# Patient Record
Sex: Female | Born: 1935
Health system: Southern US, Community
[De-identification: ages and names within clinical notes are randomized; demographics above are authoritative.]

## PROBLEM LIST (undated history)

## (undated) DIAGNOSIS — I10 Essential (primary) hypertension: Secondary | ICD-10-CM

## (undated) DIAGNOSIS — I7300 Raynaud's syndrome without gangrene: Secondary | ICD-10-CM

## (undated) DIAGNOSIS — I639 Cerebral infarction, unspecified: Secondary | ICD-10-CM

## (undated) DIAGNOSIS — M5416 Radiculopathy, lumbar region: Secondary | ICD-10-CM

## (undated) DIAGNOSIS — L659 Nonscarring hair loss, unspecified: Secondary | ICD-10-CM

## (undated) DIAGNOSIS — K219 Gastro-esophageal reflux disease without esophagitis: Secondary | ICD-10-CM

## (undated) DIAGNOSIS — J189 Pneumonia, unspecified organism: Secondary | ICD-10-CM

## (undated) DIAGNOSIS — J40 Bronchitis, not specified as acute or chronic: Secondary | ICD-10-CM

## (undated) DIAGNOSIS — R51 Headache: Secondary | ICD-10-CM

## (undated) DIAGNOSIS — M1990 Unspecified osteoarthritis, unspecified site: Secondary | ICD-10-CM

## (undated) DIAGNOSIS — H9190 Unspecified hearing loss, unspecified ear: Secondary | ICD-10-CM

## (undated) HISTORY — PX: BACK SURGERY: SHX140

## (undated) HISTORY — PX: BREAST CYST ASPIRATION: SHX578

## (undated) HISTORY — DX: Cerebral infarction, unspecified: I63.9

## (undated) HISTORY — PX: TONSILLECTOMY: SUR1361

## (undated) HISTORY — DX: Unspecified hearing loss, unspecified ear: H91.90

## (undated) HISTORY — DX: Gastro-esophageal reflux disease without esophagitis: K21.9

## (undated) HISTORY — DX: Unspecified osteoarthritis, unspecified site: M19.90

## (undated) HISTORY — DX: Radiculopathy, lumbar region: M54.16

## (undated) HISTORY — DX: Nonscarring hair loss, unspecified: L65.9

## (undated) HISTORY — PX: ABDOMINAL HYSTERECTOMY: SHX81

## (undated) HISTORY — DX: Raynaud's syndrome without gangrene: I73.00

## (undated) HISTORY — PX: OTHER SURGICAL HISTORY: SHX169

---

## 1998-06-13 ENCOUNTER — Ambulatory Visit (HOSPITAL_COMMUNITY): Admission: RE | Admit: 1998-06-13 | Discharge: 1998-06-13 | Payer: Self-pay | Admitting: Internal Medicine

## 1998-09-26 ENCOUNTER — Encounter: Payer: Self-pay | Admitting: Internal Medicine

## 1998-09-26 ENCOUNTER — Ambulatory Visit (HOSPITAL_COMMUNITY): Admission: RE | Admit: 1998-09-26 | Discharge: 1998-09-26 | Payer: Self-pay | Admitting: Internal Medicine

## 1999-03-12 ENCOUNTER — Encounter: Payer: Self-pay | Admitting: Internal Medicine

## 1999-03-12 ENCOUNTER — Ambulatory Visit (HOSPITAL_COMMUNITY): Admission: RE | Admit: 1999-03-12 | Discharge: 1999-03-12 | Payer: Self-pay | Admitting: Internal Medicine

## 1999-04-11 ENCOUNTER — Encounter: Payer: Self-pay | Admitting: Urology

## 1999-04-12 ENCOUNTER — Inpatient Hospital Stay (HOSPITAL_COMMUNITY): Admission: AD | Admit: 1999-04-12 | Discharge: 1999-04-14 | Payer: Self-pay | Admitting: Urology

## 1999-07-19 ENCOUNTER — Ambulatory Visit (HOSPITAL_COMMUNITY): Admission: RE | Admit: 1999-07-19 | Discharge: 1999-07-19 | Payer: Self-pay | Admitting: Internal Medicine

## 1999-07-19 ENCOUNTER — Encounter: Payer: Self-pay | Admitting: Internal Medicine

## 1999-11-12 DIAGNOSIS — I639 Cerebral infarction, unspecified: Secondary | ICD-10-CM

## 1999-11-12 HISTORY — DX: Cerebral infarction, unspecified: I63.9

## 2000-06-09 ENCOUNTER — Encounter: Payer: Self-pay | Admitting: Internal Medicine

## 2000-06-09 ENCOUNTER — Ambulatory Visit (HOSPITAL_COMMUNITY): Admission: RE | Admit: 2000-06-09 | Discharge: 2000-06-09 | Payer: Self-pay | Admitting: Internal Medicine

## 2000-06-13 ENCOUNTER — Encounter: Admission: RE | Admit: 2000-06-13 | Discharge: 2000-06-13 | Payer: Self-pay | Admitting: Internal Medicine

## 2000-06-13 ENCOUNTER — Encounter: Payer: Self-pay | Admitting: Internal Medicine

## 2000-06-17 ENCOUNTER — Encounter: Admission: RE | Admit: 2000-06-17 | Discharge: 2000-06-17 | Payer: Self-pay | Admitting: Internal Medicine

## 2000-06-17 ENCOUNTER — Encounter: Payer: Self-pay | Admitting: Internal Medicine

## 2000-08-08 ENCOUNTER — Encounter: Payer: Self-pay | Admitting: Gastroenterology

## 2000-08-08 ENCOUNTER — Ambulatory Visit (HOSPITAL_COMMUNITY): Admission: RE | Admit: 2000-08-08 | Discharge: 2000-08-08 | Payer: Self-pay | Admitting: Gastroenterology

## 2000-08-12 ENCOUNTER — Encounter: Payer: Self-pay | Admitting: Internal Medicine

## 2000-08-12 ENCOUNTER — Encounter: Admission: RE | Admit: 2000-08-12 | Discharge: 2000-08-12 | Payer: Self-pay | Admitting: Internal Medicine

## 2000-10-13 ENCOUNTER — Encounter (HOSPITAL_BASED_OUTPATIENT_CLINIC_OR_DEPARTMENT_OTHER): Payer: Self-pay | Admitting: General Surgery

## 2000-10-15 ENCOUNTER — Encounter (HOSPITAL_BASED_OUTPATIENT_CLINIC_OR_DEPARTMENT_OTHER): Payer: Self-pay | Admitting: General Surgery

## 2000-10-15 ENCOUNTER — Encounter (INDEPENDENT_AMBULATORY_CARE_PROVIDER_SITE_OTHER): Payer: Self-pay | Admitting: Specialist

## 2000-10-16 ENCOUNTER — Inpatient Hospital Stay (HOSPITAL_COMMUNITY): Admission: RE | Admit: 2000-10-16 | Discharge: 2000-10-17 | Payer: Self-pay | Admitting: General Surgery

## 2000-12-26 ENCOUNTER — Encounter: Admission: RE | Admit: 2000-12-26 | Discharge: 2000-12-26 | Payer: Self-pay | Admitting: Internal Medicine

## 2000-12-26 ENCOUNTER — Encounter: Payer: Self-pay | Admitting: Internal Medicine

## 2001-07-23 ENCOUNTER — Encounter: Payer: Self-pay | Admitting: Internal Medicine

## 2001-07-23 ENCOUNTER — Encounter: Admission: RE | Admit: 2001-07-23 | Discharge: 2001-07-23 | Payer: Self-pay | Admitting: Internal Medicine

## 2001-10-26 ENCOUNTER — Encounter: Admission: RE | Admit: 2001-10-26 | Discharge: 2001-10-26 | Payer: Self-pay | Admitting: Internal Medicine

## 2001-10-26 ENCOUNTER — Encounter: Payer: Self-pay | Admitting: Internal Medicine

## 2002-08-20 ENCOUNTER — Encounter: Payer: Self-pay | Admitting: Family Medicine

## 2002-08-20 ENCOUNTER — Encounter: Admission: RE | Admit: 2002-08-20 | Discharge: 2002-08-20 | Payer: Self-pay | Admitting: Family Medicine

## 2003-07-10 ENCOUNTER — Inpatient Hospital Stay (HOSPITAL_COMMUNITY): Admission: EM | Admit: 2003-07-10 | Discharge: 2003-07-16 | Payer: Self-pay | Admitting: Emergency Medicine

## 2003-07-10 ENCOUNTER — Encounter: Payer: Self-pay | Admitting: Neurology

## 2003-07-11 ENCOUNTER — Encounter: Payer: Self-pay | Admitting: Neurology

## 2003-07-12 ENCOUNTER — Encounter (INDEPENDENT_AMBULATORY_CARE_PROVIDER_SITE_OTHER): Payer: Self-pay | Admitting: Cardiology

## 2003-09-16 ENCOUNTER — Ambulatory Visit (HOSPITAL_COMMUNITY): Admission: RE | Admit: 2003-09-16 | Discharge: 2003-09-16 | Payer: Self-pay | Admitting: Internal Medicine

## 2003-10-07 ENCOUNTER — Encounter: Admission: RE | Admit: 2003-10-07 | Discharge: 2003-10-07 | Payer: Self-pay | Admitting: Internal Medicine

## 2004-04-06 ENCOUNTER — Encounter: Admission: RE | Admit: 2004-04-06 | Discharge: 2004-04-06 | Payer: Self-pay | Admitting: Internal Medicine

## 2004-05-27 ENCOUNTER — Emergency Department (HOSPITAL_COMMUNITY): Admission: EM | Admit: 2004-05-27 | Discharge: 2004-05-27 | Payer: Self-pay | Admitting: Emergency Medicine

## 2004-08-07 ENCOUNTER — Encounter: Admission: RE | Admit: 2004-08-07 | Discharge: 2004-08-07 | Payer: Self-pay | Admitting: Neurology

## 2004-08-09 ENCOUNTER — Encounter: Admission: RE | Admit: 2004-08-09 | Discharge: 2004-08-09 | Payer: Self-pay | Admitting: Neurology

## 2004-08-24 ENCOUNTER — Encounter: Admission: RE | Admit: 2004-08-24 | Discharge: 2004-08-24 | Payer: Self-pay | Admitting: Neurology

## 2004-09-11 ENCOUNTER — Encounter: Admission: RE | Admit: 2004-09-11 | Discharge: 2004-09-11 | Payer: Self-pay | Admitting: Neurology

## 2005-04-15 ENCOUNTER — Encounter: Admission: RE | Admit: 2005-04-15 | Discharge: 2005-04-15 | Payer: Self-pay | Admitting: Internal Medicine

## 2006-01-08 ENCOUNTER — Encounter: Admission: RE | Admit: 2006-01-08 | Discharge: 2006-01-08 | Payer: Self-pay | Admitting: Internal Medicine

## 2006-06-16 ENCOUNTER — Encounter: Admission: RE | Admit: 2006-06-16 | Discharge: 2006-06-16 | Payer: Self-pay | Admitting: Internal Medicine

## 2006-06-20 ENCOUNTER — Encounter: Admission: RE | Admit: 2006-06-20 | Discharge: 2006-06-20 | Payer: Self-pay | Admitting: Internal Medicine

## 2007-10-09 ENCOUNTER — Encounter: Admission: RE | Admit: 2007-10-09 | Discharge: 2007-10-09 | Payer: Self-pay | Admitting: Internal Medicine

## 2008-08-25 ENCOUNTER — Encounter: Admission: RE | Admit: 2008-08-25 | Discharge: 2008-08-25 | Payer: Self-pay | Admitting: Internal Medicine

## 2008-10-10 ENCOUNTER — Encounter: Admission: RE | Admit: 2008-10-10 | Discharge: 2008-10-10 | Payer: Self-pay | Admitting: Internal Medicine

## 2008-12-22 ENCOUNTER — Ambulatory Visit: Payer: Self-pay | Admitting: Cardiology

## 2008-12-23 ENCOUNTER — Ambulatory Visit (HOSPITAL_COMMUNITY): Admission: RE | Admit: 2008-12-23 | Discharge: 2008-12-23 | Payer: Self-pay | Admitting: Cardiology

## 2008-12-23 ENCOUNTER — Encounter: Payer: Self-pay | Admitting: Cardiology

## 2009-07-27 ENCOUNTER — Encounter: Admission: RE | Admit: 2009-07-27 | Discharge: 2009-07-27 | Payer: Self-pay | Admitting: Family Medicine

## 2009-08-29 ENCOUNTER — Encounter: Admission: RE | Admit: 2009-08-29 | Discharge: 2009-08-29 | Payer: Self-pay | Admitting: Internal Medicine

## 2009-09-18 ENCOUNTER — Encounter: Admission: RE | Admit: 2009-09-18 | Discharge: 2009-09-18 | Payer: Self-pay | Admitting: Internal Medicine

## 2009-10-25 ENCOUNTER — Encounter: Admission: RE | Admit: 2009-10-25 | Discharge: 2009-10-25 | Payer: Self-pay | Admitting: Internal Medicine

## 2010-01-08 ENCOUNTER — Encounter: Admission: RE | Admit: 2010-01-08 | Discharge: 2010-01-08 | Payer: Self-pay | Admitting: Internal Medicine

## 2010-02-14 ENCOUNTER — Encounter: Admission: RE | Admit: 2010-02-14 | Discharge: 2010-02-14 | Payer: Self-pay | Admitting: Internal Medicine

## 2010-06-20 ENCOUNTER — Encounter: Admission: RE | Admit: 2010-06-20 | Discharge: 2010-06-20 | Payer: Self-pay | Admitting: Internal Medicine

## 2010-07-25 ENCOUNTER — Encounter: Admission: RE | Admit: 2010-07-25 | Discharge: 2010-07-25 | Payer: Self-pay | Admitting: Internal Medicine

## 2010-08-19 ENCOUNTER — Emergency Department (HOSPITAL_COMMUNITY): Admission: EM | Admit: 2010-08-19 | Discharge: 2010-08-19 | Payer: Self-pay | Admitting: Emergency Medicine

## 2010-10-26 ENCOUNTER — Encounter
Admission: RE | Admit: 2010-10-26 | Discharge: 2010-10-26 | Payer: Self-pay | Source: Home / Self Care | Attending: Internal Medicine | Admitting: Internal Medicine

## 2010-12-01 ENCOUNTER — Encounter: Payer: Self-pay | Admitting: Internal Medicine

## 2010-12-02 ENCOUNTER — Encounter: Payer: Self-pay | Admitting: Internal Medicine

## 2011-03-29 NOTE — Consult Note (Signed)
NAME:  Rachael Buckley, Rachael Buckley                         ACCOUNT NO.:  [id number]   MEDICAL RECORD NO.:  [id number]                   PATIENT TYPE:  INP   LOCATION:  3006                                 FACILITY:  [facility]   PHYSICIAN:  Armanda Magic, M.D.                  DATE OF BIRTH:  [birth year]   DATE OF CONSULTATION:  07/11/2003  DATE OF DISCHARGE:                                   CONSULTATION   REFERRING PHYSICIAN:  Genene Churn. Love, M.D.   PRIMARY CARE PHYSICIAN:  Candyce Churn, M.D.   CHIEF COMPLAINT:  Aphasia with possible CVA versus TIA and palpitations.   HISTORY OF PRESENT ILLNESS:  This is a very pleasant, 77 year old, black  female with no previous cardiac history, although she did have some chest  pain approximately 2 years ago and underwent stress testing at that time,  was told was normal.  Apparently, was admitted on July 10, 2003, with  evolution of aphasia.  She has no known history of coronary disease,  hypertension, diabetes, or stroke in the past or cigarette use.  By around  4:00 p.m. on August 29, was cooking and tried to call someone but could not  get her words out.  She was taken to the emergency room.  In the emergency  room, her speech did improve, although she is still having difficulty  finding her words.  Head CT was negative for stroke.  The patient has  complained of episodic palpitations, especially at nighttime, which she  describes a sensation of her heart fluttering.   PAST MEDICAL HISTORY:  Status post left and right eye cataract surgery,  gastroesophageal reflux disease.  No allergies.  Status post  cholecystectomy, status post hysterectomy.   MEDICATIONS ON ADMISSION:  1. Zyrtec.  2. Protonix.  3. Natural herbs.   SOCIAL HISTORY:  She is married, she has an Paediatric nurse.  She  works at [employer].  No alcohol use.  No smoking.   ALLERGIES:  None.   REVIEW OF SYSTEMS:  As stated in HPI.   PHYSICAL EXAMINATION:  VITAL SIGNS:   Blood pressure 134/70, heart rate 61,  respirations 18.  GENERAL:  Well-developed, well-nourished, black female in no acute distress.  HEENT:  Benign.  NECK:  Supple without lymphadenopathy.  Carotid upstroke +2 bilaterally.  No  bruits.  LUNGS:  Clear to auscultation.  HEART:  Regular rate and rhythm, no murmurs, rubs, or gallops.  Normal S1,  S2.  ABDOMEN:  Soft, nontender, nondistended with active bowel sounds.  No  hepatosplenomegaly.  EXTREMITIES:  No cyanosis, erythema or edema.  Distal pulses.  NEUROLOGIC:  Alert and oriented times three.  She is somewhat aphasic  though.   DIAGNOSTIC IMPRESSION:  Head CT is normal.  EKG is pending.  Ribbon strip  shows normal sinus rhythm.   ASSESSMENT:  1. Aphasia with negative head CT most consist with  a transient ischemic     attack.  2. Palpitations, questions whether she may be having atrial arrhythmia such     as paroxysmal atrial flutter or fibrillation, which would increase her     risk a stroke.   PLAN:  1. Transesophageal echo tomorrow to rule out source of embolus.  2. We will need event monitor at discharge to rule out atrial arrhythmias.                                               Armanda Magic, M.D.    TT/MEDQ  D:  07/11/2003  T:  07/11/2003  Job:  [id number]   cc:   Genene Churn. Love, M.D.  [address]  [address]  [place]  [place] [address]  Fax: [phone]   Candyce Churn, M.D.  [address] [place]  [place] [address]  Fax: [phone]

## 2011-03-29 NOTE — H&P (Signed)
NAME:  Rachael Buckley, Rachael Buckley                         ACCOUNT NO.:  [id number]   MEDICAL RECORD NO.:  [id number]                   PATIENT TYPE:  EMS   LOCATION:  MINO                                 FACILITY:  [facility]   PHYSICIAN:  Genene Churn. Love, M.D.                 DATE OF BIRTH:  1931/06/30   DATE OF ADMISSION:  07/10/2003  DATE OF DISCHARGE:                                HISTORY & PHYSICAL   PATIENT'S ADDRESS:  [address]  [place], [place] [address]   REASON FOR ADMISSION:  This is one of several [facility] admissions  for this 77 year old right-handed black, married female from [place],  [place], admitted from the emergency room for evaluation of a failure  here following code stroke alarm.   HISTORY OF PRESENT ILLNESS:  Rachael Buckley has no known prior history of high  blood pressure, diabetes mellitus, coronary artery disease, stroke, or  cigarette use.  She has not been on hormone therapy but uses natural herbs  and is not on aspirin.  The day of admission, she had been cooking around  noontime to in the afternoon on Sunday, at about 4 o'clock was calling her  family to come to the meal and had difficulty getting her words out.  There  was no associated focal weakness, seizure activity, syncope, complaints of  chest pain or palpitations.  She arrived at the emergency room very quickly,  was seen by Dr. Pearletha Alfred and a CT scan of the brain was obtained  which, to my eye, showed some calcification in the basal ganglia region, but  otherwise was unremarkable.  In the emergency room, her symptomatology  improved.  She has no history of previous symptomatology similar to the  above.   PAST MEDICAL HISTORY:  Her past medical history is significant for:  1. Left and right eye cataract surgery.  2. Gastroesophageal reflux disease.  3. Seasonal allergies.  4. She is status post cholecystectomy.  5. Status post hysterectomy.   MEDICATIONS:   Medications are Zyrtec, Protonix and natural herbs.   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  She does not smoke cigarettes or drink alcohol.  She does  dip snuff.  She is married and has four sons in their 49s to 84s and two  daughters in their 61s.   ALLERGIES:  She has no known drug allergies.   FAMILY HISTORY:  Her mother died at age 60 for reasons unclear.  Her father  died at age 21 from myocardial infarction.  She has one brother who died at  age 32 from alcoholism.   PHYSICAL EXAMINATION:  GENERAL:  Her examination revealed a well-developed,  pleasant black female in no acute distress.  VITAL SIGNS:  Blood pressure -- right arm 160/90, left arm 150/80.  Heart  rate  was 79 and regular.  NECK:  There were no carotid or supraclavicular bruits heard.  The neck  flexion and extension maneuvers were unremarkable.  HEENT:  Tympanic membranes were clear.  LUNGS:  Her general examination revealed the lungs clear to auscultation.  HEART:  Examination of the heart revealed no murmurs.  ABDOMEN:  Bowel sounds were normal.  EXTREMITIES:  There was no cyanosis, clubbing or edema in extremities.  NEUROLOGIC:  Mental status:  She was alert.  She had evidence of an aphasia.  She followed one-, two- and three-step commands.  She could name objects but  had some difficulty with that.  She could repeat phrases.  She could read  and she could write but made some mistakes in her handwriting.  Her cranial  nerve examination revealed visual fields to be full, disks flat, spontaneous  venous pulsations seen, left pupil larger than the right, hearing intact,  air conduction greater than bone conduction, tongue midline, uvula midline,  gags present.  Motor examination with good strength in upper and lower  extremities, no evidence of focal drift.  Sensory exam intact to pinprick,  touch, joint position and vibration testing.  Deep tendon reflexes 1  to 2+.  Plantar responses downgoing.   LABORATORY AND ACCESSORY CLINICAL DATA:  Only laboratory data returned at  this time is a CT scan which shows some calcification in the basal ganglia  but no other significant abnormalities.   IMPRESSION:  1. Aphasia, code 784.3.  2. Stroke, code 434.01, versus transient ischemic attack, code 435.9.  3. Status post cataract surgery, both eyes.  4. Status post hysterectomy and cholecystectomy.   PLAN:  Plan at this time is to give her fluids, keep her flat, no TPA and  use aspirin.                                                    Genene Churn. Sandria Manly, M.D.    JML/MEDQ  D:  07/10/2003  T:  07/11/2003  Job:  [id number]   cc:   Candyce Churn, M.D.  [address] [place]  [place] [address]  Fax: [phone]

## 2011-03-29 NOTE — Discharge Summary (Signed)
NAME:  Rachael Buckley, BRIGHTBILL                         ACCOUNT NO.:  [id number]   MEDICAL RECORD NO.:  [id number]                   PATIENT TYPE:  INP   LOCATION:  3006                                 FACILITY:  [facility]   PHYSICIAN:  Marlan Palau, M.D.               DATE OF BIRTH:  10/16/1931   DATE OF ADMISSION:  07/10/2003  DATE OF DISCHARGE:  07/16/2003                                 DISCHARGE SUMMARY   ADMISSION DIAGNOSIS:  Onset of aphasia/rule out left brain transient  ischemic attack.   DISCHARGE DIAGNOSES:  1. Acute left posterior frontal infarction by MRI scan with transient     aphasia.  2. Atrial clot demonstrated by 2-D echocardiogram.   PROCEDURES DURING THIS ADMISSION INCLUDE:  1. CT of the head.  2. MRI of the brain.  3. MR angiogram.  4. A 2-D echocardiogram.  5. Carotid Doppler study.  6. Transcranial Dopplers.   COMPLICATIONS FROM ABOVE PROCEDURES:  None.   HISTORY OF PRESENT ILLNESS:  Rachael Buckley. Osgood is a 77 year old black female  born [birth year] with a history of gastroesophageal reflux disease.  This  patient presented with onset of difficulty with speech consistent with an  aphasia.  The patient was unable to appropriately get words out and  was  brought to the emergency room.  The patient had clinical improvement of  speech with some mild residual word-finding problems though.  Head CT scan  done in the emergency room was unremarkable for acute stroke.  The patient  was admitted for further evaluation.   PAST MEDICAL HISTORY:  1. Bilateral cataract surgery.  2. Gastroesophageal reflux disease.  3. Status post gallbladder resection.  4. Status post hysterectomy.  5. New onset of left frontal infarction by MRI with probable cardiac source     with atrial thrombus demonstrated.   The patient does not smoke or drink and has no known drug allergies.  Medications on admission include Zyrtec, Protonix, and natural herbs.   PLEASE REFER TO HISTORY AND  PHYSICAL DICTATION SIGNED FOR SOCIAL HISTORY,  FAMILY HISTORY, REVIEW OF SYSTEMS, PHYSICAL EXAMINATION.   LABORATORY VALUES:  Laboratory values notable for an INR of 2.1 upon  discharge.  White count of 7.5, hemoglobin of 14.5, hematocrit of 42.7, MCV  of 89.1, platelets of 256.  Sodium 140, potassium 3.9, chloride of 109, CO2  of 24, glucose of 107, BUN of 10, creatinine 0.9, calcium 9.1, total protein  6.5, albumin of 3.7, AST of 20, ALT of 19, ALP of 45, total bili 0.5.  Homocysteine level 6.17.  Hemoglobin A1C minimally elevated at 6.4.  Lipid  profile with a cholesterol level of 162, HDL 50, VLDL 25, and LDL of 87.   EKG reveals normal sinus rhythm, heart rate of 76, incomplete right bundle  branch block, borderline EKG noted.  Chest x-ray shows mild right basilar  atelectasis otherwise unremarkable.  HOSPITAL COURSE:  This patient has done fairly well during the course of  hospitalization.  The patient was admitted for what appeared clinically to  be a TIA event.  MRI scan of the brain was performed however, and showed an  acute left posterofrontal infarction.  MR angiogram showed mild intracranial  atherosclerotic changes in the distal MCA branches bilaterally, no  significant proximal carotid artery stenosis is seen.  The patient did  undergo a carotid Doppler study as well showing no evidence of internal  carotid artery stenosis on either side, antegrade vertebral artery flow was  noted.  Transcranial Doppler was also performed.  Formal report of this is  not available.  The patient underwent a 2-D echocardiogram study which  showed an ejection fraction of 55-65%, no left ventricular regional wall  motion abnormalities were noted, aortic valve thickness was mildly  increased.  The patient underwent a transesophageal echocardiogram which  revealed a study that was very suggestive of an atrial appendage thrombus  that was somewhat pedunculated.  For this reason, the patient was  started on  Coumadin and remained on heparin.  The patient is to be discharged at this  point taking Coumadin 5 mg a day, will have first blood draw on July 19, 2003.  The patient will have followup with [facility]  with Dr. Pearlean Brownie in 6-8 weeks.  The patient clinically has done very well, has  no hemiparesis, visual field changes, speech appears to be relatively  unremarkable, the patient is ambulatory.  The patient was seen in  consultation by Armanda Magic and has plans to have blood check by Dr. Kevan Ny  in the future for the Coumadin followup.  Discharge medications include  Coumadin beginning at 5 mg a day and Protonix 40 mg one a day.                                                Marlan Palau, M.D.    CKW/MEDQ  D:  07/16/2003  T:  07/16/2003  Job:  [id number]   cc:   Armanda Magic, M.D.  [address]  [place], [place] [address]  Fax: [phone]   Candyce Churn, M.D.  [address] [place]  [place] [address]  Fax: [phone]   [facility]  [address] [place]. [address]

## 2011-03-29 NOTE — Op Note (Signed)
. [facility]  Patient:    Rachael Buckley, Rachael Buckley                      MRN: [id number] Proc. Date: 10/15/00 Adm. Date:  [id number] Attending:  Sonda Primes                           Operative Report  PREOPERATIVE DIAGNOSIS:  Chronic calculous cholecystitis.  POSTOPERATIVE DIAGNOSIS:  Chronic calculous cholecystitis.  PROCEDURE:  Laparoscopy followed by open cholecystectomy with intraoperative cholangiogram.  SURGEON:  Luisa Hart L. Lurene Shadow, M.D.  ASSISTANT:  Marnee [place]. Wiliam Ke, M.D.  ANESTHESIA:  General.  NOTE:  This patient is a 77 year old woman presenting with upper abdominal symptoms associated with nausea, vomiting, and bloating, particularly after meals.  Gallbladder ultrasound demonstrates cholelithiasis.  She is brought to the operating room now for cholecystectomy.  She had previous lower and upper abdominal surgery, and she is aware that open cholecystectomy may not be possible due to multiple adhesions.  DESCRIPTION OF PROCEDURE:  Following the induction of anesthesia with the patient positioned supinely, the abdomen was routinely prepped and draped to be included in the sterile operative field.  Open laparoscopy was created at the umbilicus with insertion of the Hasson cannula and insufflation of the peritoneal cavity to 14 mmHg pressure.  On inserting the Hasson, there were multiple adhesions enshrouding the entire right upper quadrant.  We were not able to get above the omentum so as to lyse any adhesions.  None of the small or large intestine was viewed while within the abdomen appeared to be abnormal.  Attention was then turned to open procedure, where a right upper quadrant Kocher incision was made and then deepened through the skin and subcutaneous tissues down to the anterior rectus fascia.  The anterior rectus fascia was incised.  Rectus muscle was transected and the posterior rectus sheath and peritoneum grasped and opened.   There were again in this area multiple adhesions to the anterior abdominal wall, which were carefully lysed, carrying the dissection down to the region of the gallbladder.  The gallbladder was grasped and retracted.  Multiple adhesions of the small bowel and omentum to the gallbladder were taken down, carrying the dissection down to the region of the ampulla.  This could still not be clearly seen, and we decided to take the gallbladder down from above.  The peritoneum over the gallbladder was incised, and the gallbladder was dissected free from the liver bed using electrocautery and maintaining hemostasis throughout the course of the dissection.  At the end of the dissection, the cystic artery and cystic duct were positively identified, and the cystic duct was clipped proximally, the cystic artery doubly clipped and transected.  The cystic duct was then opened and a Taut catheter placed within the cystic duct, and this was followed by a fluoroscopic cholangiography, which showed free flow of contrast to the duodenum without any evidence of filling defects.  The cystic duct catheter was removed, and the cystic duct was doubly clipped and transected, the gallbladder removed from the operative field and forwarded for pathologic evaluation.  The right upper quadrant was the thoroughly irrigated with normal saline.  Sponge, instrument, and sharp counts were verified.  I did place a Gelfoam patch within the liver bed to control hemostasis well, and this was watched for approximately five minutes without breakthrough bleeding.  The wound was then closed in layers as follows:  Peritoneum and posterior rectus sheath closed with a running suture of 0 chromic, anterior rectus sheath and the midline closed with running #1 PDS suture.  Subcutaneous tissue was irrigated, skin closed with staples, sterile dressings applied.  Anesthetic reversed, patient removed from the operating room to the recovery room  in stable condition, having tolerated the procedure well. DD:  10/15/00 TD:  10/15/00 Job: [id number]

## 2011-10-28 ENCOUNTER — Other Ambulatory Visit: Payer: Self-pay | Admitting: Internal Medicine

## 2011-10-28 DIAGNOSIS — Z1231 Encounter for screening mammogram for malignant neoplasm of breast: Secondary | ICD-10-CM

## 2011-11-27 ENCOUNTER — Ambulatory Visit
Admission: RE | Admit: 2011-11-27 | Discharge: 2011-11-27 | Disposition: A | Discharge status: Home / Self Care | Payer: Medicare HMO | Source: Ambulatory Visit | Attending: Internal Medicine | Admitting: Internal Medicine

## 2011-11-27 DIAGNOSIS — Z1231 Encounter for screening mammogram for malignant neoplasm of breast: Secondary | ICD-10-CM

## 2012-11-30 ENCOUNTER — Other Ambulatory Visit: Payer: Self-pay | Admitting: Internal Medicine

## 2012-11-30 DIAGNOSIS — Z1231 Encounter for screening mammogram for malignant neoplasm of breast: Secondary | ICD-10-CM

## 2012-11-30 DIAGNOSIS — M79622 Pain in left upper arm: Secondary | ICD-10-CM

## 2012-11-30 DIAGNOSIS — N644 Mastodynia: Secondary | ICD-10-CM

## 2012-12-01 ENCOUNTER — Other Ambulatory Visit: Payer: Self-pay | Admitting: Internal Medicine

## 2012-12-01 DIAGNOSIS — R42 Dizziness and giddiness: Secondary | ICD-10-CM

## 2012-12-02 ENCOUNTER — Ambulatory Visit
Admission: RE | Admit: 2012-12-02 | Discharge: 2012-12-02 | Disposition: A | Discharge status: Home / Self Care | Payer: Medicare HMO | Source: Ambulatory Visit | Attending: Internal Medicine | Admitting: Internal Medicine

## 2012-12-02 DIAGNOSIS — R42 Dizziness and giddiness: Secondary | ICD-10-CM

## 2012-12-15 ENCOUNTER — Other Ambulatory Visit: Payer: Self-pay | Admitting: Dermatology

## 2012-12-24 ENCOUNTER — Ambulatory Visit: Payer: Medicare HMO

## 2013-02-24 ENCOUNTER — Ambulatory Visit: Payer: Medicare HMO

## 2013-03-17 ENCOUNTER — Other Ambulatory Visit: Payer: Self-pay | Admitting: Internal Medicine

## 2013-03-17 DIAGNOSIS — N644 Mastodynia: Secondary | ICD-10-CM

## 2013-03-17 DIAGNOSIS — M79622 Pain in left upper arm: Secondary | ICD-10-CM

## 2013-03-30 ENCOUNTER — Ambulatory Visit
Admission: RE | Admit: 2013-03-30 | Discharge: 2013-03-30 | Disposition: A | Discharge status: Home / Self Care | Payer: Medicare HMO | Source: Ambulatory Visit | Attending: Internal Medicine | Admitting: Internal Medicine

## 2013-03-30 DIAGNOSIS — N644 Mastodynia: Secondary | ICD-10-CM

## 2013-03-30 DIAGNOSIS — M79622 Pain in left upper arm: Secondary | ICD-10-CM

## 2013-12-07 ENCOUNTER — Ambulatory Visit
Admission: RE | Admit: 2013-12-07 | Discharge: 2013-12-07 | Disposition: A | Discharge status: Home / Self Care | Payer: Medicare HMO | Source: Ambulatory Visit | Attending: Internal Medicine | Admitting: Internal Medicine

## 2013-12-07 ENCOUNTER — Other Ambulatory Visit: Payer: Self-pay | Admitting: Internal Medicine

## 2013-12-07 DIAGNOSIS — R05 Cough: Secondary | ICD-10-CM

## 2013-12-07 DIAGNOSIS — R059 Cough, unspecified: Secondary | ICD-10-CM

## 2014-01-07 ENCOUNTER — Other Ambulatory Visit: Payer: Self-pay | Admitting: Internal Medicine

## 2014-01-07 DIAGNOSIS — I672 Cerebral atherosclerosis: Secondary | ICD-10-CM

## 2014-01-12 ENCOUNTER — Ambulatory Visit
Admission: RE | Admit: 2014-01-12 | Discharge: 2014-01-12 | Disposition: A | Discharge status: Home / Self Care | Payer: Commercial Managed Care - HMO | Source: Ambulatory Visit | Attending: Internal Medicine | Admitting: Internal Medicine

## 2014-01-12 DIAGNOSIS — I672 Cerebral atherosclerosis: Secondary | ICD-10-CM

## 2014-03-03 ENCOUNTER — Other Ambulatory Visit: Payer: Self-pay

## 2014-03-03 DIAGNOSIS — Z1231 Encounter for screening mammogram for malignant neoplasm of breast: Secondary | ICD-10-CM

## 2014-03-09 ENCOUNTER — Other Ambulatory Visit: Payer: Self-pay | Admitting: Gastroenterology

## 2014-03-14 ENCOUNTER — Encounter (HOSPITAL_COMMUNITY): Payer: Self-pay | Admitting: Pharmacy Technician

## 2014-03-24 ENCOUNTER — Encounter (HOSPITAL_COMMUNITY): Payer: Self-pay | Admitting: *Deleted

## 2014-04-01 ENCOUNTER — Ambulatory Visit
Admission: RE | Admit: 2014-04-01 | Discharge: 2014-04-01 | Disposition: A | Discharge status: Home / Self Care | Payer: Medicare HMO | Source: Ambulatory Visit

## 2014-04-01 DIAGNOSIS — Z1231 Encounter for screening mammogram for malignant neoplasm of breast: Secondary | ICD-10-CM

## 2014-04-05 ENCOUNTER — Encounter (HOSPITAL_COMMUNITY)
Admission: RE | Disposition: A | Discharge status: Home / Self Care | Payer: Self-pay | Source: Ambulatory Visit | Attending: Gastroenterology

## 2014-04-05 ENCOUNTER — Encounter (HOSPITAL_COMMUNITY): Payer: Self-pay | Admitting: Anesthesiology

## 2014-04-05 ENCOUNTER — Ambulatory Visit (HOSPITAL_COMMUNITY)
Admission: RE | Admit: 2014-04-05 | Discharge: 2014-04-05 | Disposition: A | Discharge status: Home / Self Care | Payer: Medicare HMO | Source: Ambulatory Visit | Attending: Gastroenterology | Admitting: Gastroenterology

## 2014-04-05 ENCOUNTER — Encounter (HOSPITAL_COMMUNITY): Payer: Medicare HMO | Admitting: Anesthesiology

## 2014-04-05 ENCOUNTER — Ambulatory Visit (HOSPITAL_COMMUNITY): Payer: Medicare HMO | Admitting: Anesthesiology

## 2014-04-05 DIAGNOSIS — R51 Headache: Secondary | ICD-10-CM | POA: Insufficient documentation

## 2014-04-05 DIAGNOSIS — Z5309 Procedure and treatment not carried out because of other contraindication: Secondary | ICD-10-CM | POA: Insufficient documentation

## 2014-04-05 DIAGNOSIS — H918X9 Other specified hearing loss, unspecified ear: Secondary | ICD-10-CM | POA: Insufficient documentation

## 2014-04-05 DIAGNOSIS — J309 Allergic rhinitis, unspecified: Secondary | ICD-10-CM | POA: Insufficient documentation

## 2014-04-05 DIAGNOSIS — IMO0002 Reserved for concepts with insufficient information to code with codable children: Secondary | ICD-10-CM | POA: Insufficient documentation

## 2014-04-05 DIAGNOSIS — I7300 Raynaud's syndrome without gangrene: Secondary | ICD-10-CM | POA: Insufficient documentation

## 2014-04-05 DIAGNOSIS — Z791 Long term (current) use of non-steroidal anti-inflammatories (NSAID): Secondary | ICD-10-CM | POA: Insufficient documentation

## 2014-04-05 DIAGNOSIS — N6019 Diffuse cystic mastopathy of unspecified breast: Secondary | ICD-10-CM | POA: Insufficient documentation

## 2014-04-05 DIAGNOSIS — K219 Gastro-esophageal reflux disease without esophagitis: Secondary | ICD-10-CM | POA: Insufficient documentation

## 2014-04-05 DIAGNOSIS — K921 Melena: Secondary | ICD-10-CM | POA: Insufficient documentation

## 2014-04-05 DIAGNOSIS — R4789 Other speech disturbances: Secondary | ICD-10-CM | POA: Insufficient documentation

## 2014-04-05 DIAGNOSIS — N3946 Mixed incontinence: Secondary | ICD-10-CM | POA: Insufficient documentation

## 2014-04-05 DIAGNOSIS — Z7982 Long term (current) use of aspirin: Secondary | ICD-10-CM | POA: Insufficient documentation

## 2014-04-05 DIAGNOSIS — Z8673 Personal history of transient ischemic attack (TIA), and cerebral infarction without residual deficits: Secondary | ICD-10-CM | POA: Insufficient documentation

## 2014-04-05 HISTORY — DX: Headache: R51

## 2014-04-05 HISTORY — PX: ESOPHAGOGASTRODUODENOSCOPY (EGD) WITH PROPOFOL: SHX5813

## 2014-04-05 HISTORY — DX: Cerebral infarction, unspecified: I63.9

## 2014-04-05 HISTORY — PX: COLONOSCOPY WITH PROPOFOL: SHX5780

## 2014-04-05 SURGERY — COLONOSCOPY WITH PROPOFOL
Anesthesia: Monitor Anesthesia Care

## 2014-04-05 MED ORDER — LACTATED RINGERS IV SOLN
INTRAVENOUS | Status: DC
  Administered 2014-04-05: 12:00:00 via INTRAVENOUS

## 2014-04-05 MED ORDER — ONDANSETRON HCL 4 MG/2ML IJ SOLN
INTRAMUSCULAR | Status: DC | PRN
  Administered 2014-04-05: 4 mg via INTRAVENOUS

## 2014-04-05 MED ORDER — MIDAZOLAM HCL 2 MG/2ML IJ SOLN
INTRAMUSCULAR | Status: AC
  Filled 2014-04-05: qty 2

## 2014-04-05 MED ORDER — SODIUM CHLORIDE 0.9 % IV SOLN: INTRAVENOUS | Status: DC

## 2014-04-05 MED ORDER — LIDOCAINE HCL (CARDIAC) 20 MG/ML IV SOLN
INTRAVENOUS | Status: AC
Start: 2014-04-05 — End: 2014-04-05
  Filled 2014-04-05: qty 5

## 2014-04-05 MED ORDER — DEXAMETHASONE SODIUM PHOSPHATE 10 MG/ML IJ SOLN
INTRAMUSCULAR | Status: AC
  Filled 2014-04-05: qty 1

## 2014-04-05 MED ORDER — KETAMINE HCL 10 MG/ML IJ SOLN
INTRAMUSCULAR | Status: DC | PRN
  Administered 2014-04-05 (×4): .5 mg via INTRAVENOUS
  Administered 2014-04-05: 2 mg via INTRAVENOUS
  Administered 2014-04-05 (×8): .5 mg via INTRAVENOUS

## 2014-04-05 MED ORDER — BUTAMBEN-TETRACAINE-BENZOCAINE 2-2-14 % EX AERO
INHALATION_SPRAY | CUTANEOUS | Status: DC | PRN
  Administered 2014-04-05: 1 via TOPICAL

## 2014-04-05 MED ORDER — GLYCOPYRROLATE 0.2 MG/ML IJ SOLN
INTRAMUSCULAR | Status: DC | PRN
  Administered 2014-04-05 (×2): 0.1 mg via INTRAVENOUS

## 2014-04-05 MED ORDER — PROPOFOL 10 MG/ML IV BOLUS
INTRAVENOUS | Status: DC | PRN
  Administered 2014-04-05: 50 mg via INTRAVENOUS
  Administered 2014-04-05 (×2): 10 mg via INTRAVENOUS

## 2014-04-05 MED ORDER — PROPOFOL 10 MG/ML IV BOLUS
INTRAVENOUS | Status: AC
  Filled 2014-04-05: qty 20

## 2014-04-05 MED ORDER — DEXAMETHASONE SODIUM PHOSPHATE 10 MG/ML IJ SOLN
INTRAMUSCULAR | Status: DC | PRN
  Administered 2014-04-05: 10 mg via INTRAVENOUS

## 2014-04-05 MED ORDER — PROPOFOL INFUSION 10 MG/ML OPTIME
INTRAVENOUS | Status: DC | PRN
  Administered 2014-04-05: 50 ug/kg/min via INTRAVENOUS

## 2014-04-05 MED ORDER — KETAMINE HCL 10 MG/ML IJ SOLN
INTRAMUSCULAR | Status: AC
  Filled 2014-04-05: qty 1

## 2014-04-05 MED ORDER — FENTANYL CITRATE 0.05 MG/ML IJ SOLN
INTRAMUSCULAR | Status: AC
  Filled 2014-04-05: qty 2

## 2014-04-05 MED ORDER — ESMOLOL HCL 10 MG/ML IV SOLN
INTRAVENOUS | Status: DC | PRN
  Administered 2014-04-05: 10 mg via INTRAVENOUS
  Administered 2014-04-05 (×2): 5 mg via INTRAVENOUS

## 2014-04-05 MED ORDER — PROPOFOL 10 MG/ML IV BOLUS
INTRAVENOUS | Status: AC
  Filled 2014-04-05: qty 40

## 2014-04-05 MED ORDER — LIDOCAINE HCL (CARDIAC) 20 MG/ML IV SOLN
INTRAVENOUS | Status: DC | PRN
  Administered 2014-04-05 (×2): 50 mg via INTRAVENOUS

## 2014-04-05 MED ORDER — ONDANSETRON HCL 4 MG/2ML IJ SOLN
INTRAMUSCULAR | Status: AC
  Filled 2014-04-05: qty 2

## 2014-04-05 MED ORDER — GLYCOPYRROLATE 0.2 MG/ML IJ SOLN
INTRAMUSCULAR | Status: AC
  Filled 2014-04-05: qty 1

## 2014-04-05 SURGICAL SUPPLY — 25 items

## 2014-04-05 NOTE — Discharge Instructions (Addendum)
Monitored Anesthesia Care  °Monitored anesthesia care is an anesthesia service for a medical procedure. Anesthesia is the loss of the ability to feel pain. It is produced by medications called anesthetics. It may affect a small area of your body (local anesthesia), a large area of your body (regional anesthesia), or your entire body (general anesthesia). The need for monitored anesthesia care depends your procedure, your condition, and the potential need for regional or general anesthesia. It is often provided during procedures where:  °· General anesthesia may be needed if there are complications. This is because you need special care when you are under general anesthesia.   °· You will be under local or regional anesthesia. This is so that you are able to have higher levels of anesthesia if needed.   °· You will receive calming medications (sedatives). This is especially the case if sedatives are given to put you in a semi-conscious state of relaxation (deep sedation). This is because the amount of sedative needed to produce this state can be hard to predict. Too much of a sedative can produce general anesthesia. °Monitored anesthesia care is performed by one or more caregivers who have special training in all types of anesthesia. You will need to meet with these caregivers before your procedure. During this meeting, they will ask you about your medical history. They will also give you instructions to follow. (For example, you will need to stop eating and drinking before your procedure. You may also need to stop or change medications you are taking.) During your procedure, your caregivers will stay with you. They will:  °· Watch your condition. This includes watching you blood pressure, breathing, and level of pain.   °· Diagnose and treat problems that occur.   °· Give medications if they are needed. These may include calming medications (sedatives) and anesthetics.   °· Make sure you are comfortable.   °Having  monitored anesthesia care does not necessarily mean that you will be under anesthesia. It does mean that your caregivers will be able to manage anesthesia if you need it or if it occurs. It also means that you will be able to have a different type of anesthesia than you are having if you need it. When your procedure is complete, your caregivers will continue to watch your condition. They will make sure any medications wear off before you are allowed to go home.  °Document Released: 07/24/2005 Document Revised: 02/22/2013 Document Reviewed: 12/09/2012 °ExitCare® Patient Information ©2014 [employer]. ° ° °Colonoscopy, Care After °Refer to this sheet in the next few weeks. These instructions provide you with information on caring for yourself after your procedure. Your health care provider may also give you more specific instructions. Your treatment has been planned according to current medical practices, but problems sometimes occur. Call your health care provider if you have any problems or questions after your procedure. °WHAT TO EXPECT AFTER THE PROCEDURE  °After your procedure, it is typical to have the following: °· A small amount of blood in your stool. °· Moderate amounts of gas and mild abdominal cramping or bloating. °HOME CARE INSTRUCTIONS °· Do not drive, operate machinery, or sign important documents for 24 hours. °· You may shower and resume your regular physical activities, but move at a slower pace for the first 24 hours. °· Take frequent rest periods for the first 24 hours. °· Walk around or put a warm pack on your abdomen to help reduce abdominal cramping and bloating. °· Drink enough fluids to keep your urine clear   or pale yellow. °· You may resume your normal diet as instructed by your health care provider. Avoid heavy or fried foods that are hard to digest. °· Avoid drinking alcohol for 24 hours or as instructed by your health care provider. °· Only take over-the-counter or prescription  medicines as directed by your health care provider. °· If a tissue sample (biopsy) was taken during your procedure: °· Do not take aspirin or blood thinners for 7 days, or as instructed by your health care provider. °· Do not drink alcohol for 7 days, or as instructed by your health care provider. °· Eat soft foods for the first 24 hours. °SEEK MEDICAL CARE IF: °You have persistent spotting of blood in your stool 2 3 days after the procedure. °SEEK IMMEDIATE MEDICAL CARE IF: °· You have more than a small spotting of blood in your stool. °· You pass large blood clots in your stool. °· Your abdomen is swollen (distended). °· You have nausea or vomiting. °· You have a fever. °· You have increasing abdominal pain that is not relieved with medicine. °Document Released: 06/11/2004 Document Revised: 08/18/2013 Document Reviewed: 07/05/2013 °ExitCare® Patient Information ©2014 [employer]. ° °

## 2014-04-05 NOTE — Anesthesia Postprocedure Evaluation (Signed)
  Anesthesia Post-op Note  Patient: Rachael Buckley  Procedure(s) Performed: Procedure(s) (LRB): COLONOSCOPY WITH PROPOFOL (N/A) ESOPHAGOGASTRODUODENOSCOPY (EGD) WITH PROPOFOL (N/A)  Patient Location: PACU  Anesthesia Type: MAC  Level of Consciousness: awake and alert   Airway and Oxygen Therapy: Patient Spontanous Breathing  Post-op Pain: mild  Post-op Assessment: Post-op Vital signs reviewed, Patient's Cardiovascular Status Stable, Respiratory Function Stable, Patent Airway and No signs of Nausea or vomiting  Last Vitals:  Filed Vitals:   04/05/14 1415  BP: 169/61  Pulse: 82  Temp:   Resp: 21    Post-op Vital Signs: stable   Complications: No apparent anesthesia complications

## 2014-04-05 NOTE — Anesthesia Preprocedure Evaluation (Addendum)
Anesthesia Evaluation  Patient identified by MRN, date of birth, ID band Patient awake    Reviewed: Allergy & Precautions, H&P , NPO status , Patient's Chart, lab work & pertinent test results  Airway Mallampati: II TM Distance: >3 FB Neck ROM: Full    Dental  (+) Poor Dentition, Loose,    Pulmonary neg pulmonary ROS,  breath sounds clear to auscultation  Pulmonary exam normal       Cardiovascular negative cardio ROS  Rhythm:Regular Rate:Normal     Neuro/Psych  Headaches, Speech impediment CVA, Residual Symptoms negative psych ROS   GI/Hepatic negative GI ROS, Neg liver ROS,   Endo/Other  negative endocrine ROS  Renal/GU negative Renal ROS  negative genitourinary   Musculoskeletal negative musculoskeletal ROS (+)   Abdominal   Peds negative pediatric ROS (+)  Hematology negative hematology ROS (+)   Anesthesia Other Findings   Reproductive/Obstetrics negative OB ROS                          Anesthesia Physical Anesthesia Plan  ASA: III  Anesthesia Plan: MAC   Post-op Pain Management:    Induction: Intravenous  Airway Management Planned:   Additional Equipment:   Intra-op Plan:   Post-operative Plan:   Informed Consent: I have reviewed the patients History and Physical, chart, labs and discussed the procedure including the risks, benefits and alternatives for the proposed anesthesia with the patient or authorized representative who has indicated his/her understanding and acceptance.   Dental advisory given  Plan Discussed with: CRNA  Anesthesia Plan Comments:         Anesthesia Quick Evaluation

## 2014-04-05 NOTE — Op Note (Signed)
Problems: Hematochezia and melena associated with normal hemoglobin  Endoscopist: Danise Edge  Premedication: Propofol administered by anesthesia  Procedure: Canceled esophagogastroduodenoscopy The patient was placed in the left lateral decubitus position. The Pentax gastroscope was passed over the tongue and advanced to the hypopharynx. The hypopharynx, larynx, and vocal cords appeared normal. The patient's respiration became quite shallow with oxygen desaturation to 50%. The gastroscope was removed. The patient airway was opened by performing the jaw thrust maneuver. The patient's oxygen saturation recovered. The esophagogastroduodenoscopy was canceled.  Procedure: Diagnostic colonoscopy Anal inspection and digital rectal exam were normal. The Pentax pediatric colonoscope was introduced into the rectum and advanced to the cecum. A normal-appearing ileocecal valve was identified. Colonic preparation for the exam today was good.  Rectum. Normal. Retroflexed view of the distal rectum normal  Sigmoid colon and descending colon. Normal  Splenic flexure. Normal  Transverse colon. Normal  Hepatic flexure. Normal  Ascending colon. Normal  Cecum and ileocecal valve. Normal  Assessment: Normal diagnostic proctocolonoscopy to the cecum  Recommendation: Schedule barium upper GI x-ray series.

## 2014-04-05 NOTE — Transfer of Care (Signed)
Immediate Anesthesia Transfer of Care Note  Patient: Rachael Buckley  Procedure(s) Performed: Procedure(s): COLONOSCOPY WITH PROPOFOL (N/A) ESOPHAGOGASTRODUODENOSCOPY (EGD) WITH PROPOFOL (N/A)  Patient Location: PACU, Endo Recovery  Anesthesia Type:MAC  Level of Consciousness: Patient easily awoken, sedated, comfortable, cooperative, following commands, responds to stimulation.   Airway & Oxygen Therapy: Patient spontaneously breathing, ventilating well, oxygen via simple oxygen mask.  Post-op Assessment: Report given to PACU RN, vital signs reviewed and stable, moving all extremities.   Post vital signs: Reviewed and stable.  Complications: No apparent anesthesia complications

## 2014-04-05 NOTE — H&P (Signed)
  Problem: Hematochezia and melena associated with normal hemoglobin  History: The patient is an 80 year old female born [birth year]. She chronically takes ibuprofen for arthritis and aspirin to prevent a second stroke. She intermittently passes fresh blood with her otherwise normal bowel movements. Occasionally her stool is dark. She denies abdominal pain.   The patient underwent a normal colonoscopy on 02/03/2006.  The patient is scheduled to undergo a diagnostic esophagogastroduodenoscopy and colonoscopy to evaluate gastrointestinal bleeding.  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 Lumbar radiculopathy. Alopecia areata. Gastroesophageal reflux. Fibrocystic breast disease. Urgent and stress urinary incontinence. Allergic rhinitis. Mild Raynaud's phenomenon. High-frequency hearing loss. Cholecystectomy. Total abdominal hysterectomy. Bilateral salpingo-oophorectomy. Appendectomy. Cataract surgery. Left onion surgery. Lumbar back surgery. Tonsillectomy.  Allergies: Morphine  Exam: The patient is alert and lying comfortably on the endoscopy stretcher. Abdomen is soft and nontender to palpation. Lungs are clear to auscultation. Cardiac exam reveals a regular rhythm.  Plan: Proceed with diagnostic esophagogastroduodenoscopy and colonoscopy to evaluate gastrointestinal bleeding

## 2014-04-06 ENCOUNTER — Encounter (HOSPITAL_COMMUNITY): Payer: Self-pay | Admitting: Gastroenterology

## 2014-04-13 ENCOUNTER — Other Ambulatory Visit: Payer: Self-pay | Admitting: Gastroenterology

## 2014-04-13 DIAGNOSIS — K921 Melena: Secondary | ICD-10-CM

## 2014-04-19 ENCOUNTER — Ambulatory Visit
Admission: RE | Admit: 2014-04-19 | Discharge: 2014-04-19 | Disposition: A | Discharge status: Home / Self Care | Payer: Medicare HMO | Source: Ambulatory Visit | Attending: Gastroenterology | Admitting: Gastroenterology

## 2014-04-19 DIAGNOSIS — K921 Melena: Secondary | ICD-10-CM

## 2014-12-16 DIAGNOSIS — M545 Low back pain: Secondary | ICD-10-CM | POA: Diagnosis not present

## 2014-12-16 DIAGNOSIS — R300 Dysuria: Secondary | ICD-10-CM | POA: Diagnosis not present

## 2014-12-16 DIAGNOSIS — L853 Xerosis cutis: Secondary | ICD-10-CM | POA: Diagnosis not present

## 2015-01-24 ENCOUNTER — Ambulatory Visit
Admission: RE | Admit: 2015-01-24 | Discharge: 2015-01-24 | Disposition: A | Discharge status: Home / Self Care | Payer: Commercial Managed Care - HMO | Source: Ambulatory Visit | Attending: Internal Medicine | Admitting: Internal Medicine

## 2015-01-24 ENCOUNTER — Other Ambulatory Visit: Payer: Self-pay | Admitting: Internal Medicine

## 2015-01-24 DIAGNOSIS — M859 Disorder of bone density and structure, unspecified: Secondary | ICD-10-CM | POA: Diagnosis not present

## 2015-01-24 DIAGNOSIS — Z0001 Encounter for general adult medical examination with abnormal findings: Secondary | ICD-10-CM | POA: Diagnosis not present

## 2015-01-24 DIAGNOSIS — R0989 Other specified symptoms and signs involving the circulatory and respiratory systems: Secondary | ICD-10-CM

## 2015-01-24 DIAGNOSIS — J988 Other specified respiratory disorders: Secondary | ICD-10-CM | POA: Diagnosis not present

## 2015-01-24 DIAGNOSIS — R51 Headache: Secondary | ICD-10-CM | POA: Diagnosis not present

## 2015-01-24 DIAGNOSIS — R0789 Other chest pain: Secondary | ICD-10-CM | POA: Diagnosis not present

## 2015-01-24 DIAGNOSIS — E782 Mixed hyperlipidemia: Secondary | ICD-10-CM | POA: Diagnosis not present

## 2015-01-24 DIAGNOSIS — M25569 Pain in unspecified knee: Secondary | ICD-10-CM | POA: Diagnosis not present

## 2015-01-24 DIAGNOSIS — M4806 Spinal stenosis, lumbar region: Secondary | ICD-10-CM | POA: Diagnosis not present

## 2015-01-24 DIAGNOSIS — Z1389 Encounter for screening for other disorder: Secondary | ICD-10-CM | POA: Diagnosis not present

## 2015-02-24 DIAGNOSIS — M25561 Pain in right knee: Secondary | ICD-10-CM | POA: Diagnosis not present

## 2015-02-24 DIAGNOSIS — I6340 Cerebral infarction due to embolism of unspecified cerebral artery: Secondary | ICD-10-CM | POA: Diagnosis not present

## 2015-02-24 DIAGNOSIS — I10 Essential (primary) hypertension: Secondary | ICD-10-CM | POA: Diagnosis not present

## 2015-02-24 DIAGNOSIS — K219 Gastro-esophageal reflux disease without esophagitis: Secondary | ICD-10-CM | POA: Diagnosis not present

## 2015-02-24 DIAGNOSIS — J988 Other specified respiratory disorders: Secondary | ICD-10-CM | POA: Diagnosis not present

## 2015-03-15 ENCOUNTER — Other Ambulatory Visit: Payer: Self-pay

## 2015-03-15 DIAGNOSIS — Z1231 Encounter for screening mammogram for malignant neoplasm of breast: Secondary | ICD-10-CM

## 2015-04-05 DIAGNOSIS — K219 Gastro-esophageal reflux disease without esophagitis: Secondary | ICD-10-CM | POA: Diagnosis not present

## 2015-04-05 DIAGNOSIS — J988 Other specified respiratory disorders: Secondary | ICD-10-CM | POA: Diagnosis not present

## 2015-04-05 DIAGNOSIS — I10 Essential (primary) hypertension: Secondary | ICD-10-CM | POA: Diagnosis not present

## 2015-04-05 DIAGNOSIS — M25569 Pain in unspecified knee: Secondary | ICD-10-CM | POA: Diagnosis not present

## 2015-04-07 ENCOUNTER — Ambulatory Visit: Payer: Commercial Managed Care - HMO

## 2015-05-31 ENCOUNTER — Ambulatory Visit
Admission: RE | Admit: 2015-05-31 | Discharge: 2015-05-31 | Disposition: A | Discharge status: Home / Self Care | Payer: Commercial Managed Care - HMO | Source: Ambulatory Visit

## 2015-05-31 DIAGNOSIS — Z1231 Encounter for screening mammogram for malignant neoplasm of breast: Secondary | ICD-10-CM | POA: Diagnosis not present

## 2015-07-05 DIAGNOSIS — I6340 Cerebral infarction due to embolism of unspecified cerebral artery: Secondary | ICD-10-CM | POA: Diagnosis not present

## 2015-07-05 DIAGNOSIS — J988 Other specified respiratory disorders: Secondary | ICD-10-CM | POA: Diagnosis not present

## 2015-07-05 DIAGNOSIS — M25569 Pain in unspecified knee: Secondary | ICD-10-CM | POA: Diagnosis not present

## 2015-07-05 DIAGNOSIS — L209 Atopic dermatitis, unspecified: Secondary | ICD-10-CM | POA: Diagnosis not present

## 2015-07-05 DIAGNOSIS — K219 Gastro-esophageal reflux disease without esophagitis: Secondary | ICD-10-CM | POA: Diagnosis not present

## 2015-07-05 DIAGNOSIS — I10 Essential (primary) hypertension: Secondary | ICD-10-CM | POA: Diagnosis not present

## 2015-07-10 DIAGNOSIS — H5210 Myopia, unspecified eye: Secondary | ICD-10-CM | POA: Diagnosis not present

## 2015-07-10 DIAGNOSIS — H52203 Unspecified astigmatism, bilateral: Secondary | ICD-10-CM | POA: Diagnosis not present

## 2015-08-16 DIAGNOSIS — K219 Gastro-esophageal reflux disease without esophagitis: Secondary | ICD-10-CM | POA: Diagnosis not present

## 2015-08-16 DIAGNOSIS — Z23 Encounter for immunization: Secondary | ICD-10-CM | POA: Diagnosis not present

## 2015-08-16 DIAGNOSIS — S40019A Contusion of unspecified shoulder, initial encounter: Secondary | ICD-10-CM | POA: Diagnosis not present

## 2015-09-22 DIAGNOSIS — I10 Essential (primary) hypertension: Secondary | ICD-10-CM | POA: Diagnosis not present

## 2015-09-22 DIAGNOSIS — M19031 Primary osteoarthritis, right wrist: Secondary | ICD-10-CM | POA: Diagnosis not present

## 2015-09-22 DIAGNOSIS — J309 Allergic rhinitis, unspecified: Secondary | ICD-10-CM | POA: Diagnosis not present

## 2015-09-22 DIAGNOSIS — S40019A Contusion of unspecified shoulder, initial encounter: Secondary | ICD-10-CM | POA: Diagnosis not present

## 2015-09-22 DIAGNOSIS — K219 Gastro-esophageal reflux disease without esophagitis: Secondary | ICD-10-CM | POA: Diagnosis not present

## 2015-09-22 DIAGNOSIS — I6340 Cerebral infarction due to embolism of unspecified cerebral artery: Secondary | ICD-10-CM | POA: Diagnosis not present

## 2016-04-30 DIAGNOSIS — Z0001 Encounter for general adult medical examination with abnormal findings: Secondary | ICD-10-CM | POA: Diagnosis not present

## 2016-04-30 DIAGNOSIS — G603 Idiopathic progressive neuropathy: Secondary | ICD-10-CM | POA: Diagnosis not present

## 2016-04-30 DIAGNOSIS — Z79899 Other long term (current) drug therapy: Secondary | ICD-10-CM | POA: Diagnosis not present

## 2016-04-30 DIAGNOSIS — K219 Gastro-esophageal reflux disease without esophagitis: Secondary | ICD-10-CM | POA: Diagnosis not present

## 2016-04-30 DIAGNOSIS — K5900 Constipation, unspecified: Secondary | ICD-10-CM | POA: Diagnosis not present

## 2016-04-30 DIAGNOSIS — Z1389 Encounter for screening for other disorder: Secondary | ICD-10-CM | POA: Diagnosis not present

## 2016-04-30 DIAGNOSIS — J309 Allergic rhinitis, unspecified: Secondary | ICD-10-CM | POA: Diagnosis not present

## 2016-04-30 DIAGNOSIS — I10 Essential (primary) hypertension: Secondary | ICD-10-CM | POA: Diagnosis not present

## 2016-04-30 DIAGNOSIS — G629 Polyneuropathy, unspecified: Secondary | ICD-10-CM | POA: Diagnosis not present

## 2016-04-30 DIAGNOSIS — H9313 Tinnitus, bilateral: Secondary | ICD-10-CM | POA: Diagnosis not present

## 2016-04-30 DIAGNOSIS — I6340 Cerebral infarction due to embolism of unspecified cerebral artery: Secondary | ICD-10-CM | POA: Diagnosis not present

## 2016-04-30 DIAGNOSIS — H9190 Unspecified hearing loss, unspecified ear: Secondary | ICD-10-CM | POA: Diagnosis not present

## 2016-04-30 DIAGNOSIS — R300 Dysuria: Secondary | ICD-10-CM | POA: Diagnosis not present

## 2016-06-05 ENCOUNTER — Other Ambulatory Visit: Payer: Self-pay | Admitting: Internal Medicine

## 2016-06-05 DIAGNOSIS — Z1231 Encounter for screening mammogram for malignant neoplasm of breast: Secondary | ICD-10-CM

## 2016-07-10 ENCOUNTER — Ambulatory Visit: Payer: Commercial Managed Care - HMO

## 2016-07-22 DIAGNOSIS — J309 Allergic rhinitis, unspecified: Secondary | ICD-10-CM | POA: Diagnosis not present

## 2016-07-22 DIAGNOSIS — R0982 Postnasal drip: Secondary | ICD-10-CM | POA: Diagnosis not present

## 2016-07-22 DIAGNOSIS — K219 Gastro-esophageal reflux disease without esophagitis: Secondary | ICD-10-CM | POA: Diagnosis not present

## 2016-08-15 DIAGNOSIS — Z23 Encounter for immunization: Secondary | ICD-10-CM | POA: Diagnosis not present

## 2016-08-16 ENCOUNTER — Other Ambulatory Visit: Payer: Self-pay | Admitting: Internal Medicine

## 2016-08-16 ENCOUNTER — Ambulatory Visit
Admission: RE | Admit: 2016-08-16 | Discharge: 2016-08-16 | Disposition: A | Discharge status: Home / Self Care | Payer: Commercial Managed Care - HMO | Source: Ambulatory Visit | Attending: Internal Medicine | Admitting: Internal Medicine

## 2016-08-16 DIAGNOSIS — Z1231 Encounter for screening mammogram for malignant neoplasm of breast: Secondary | ICD-10-CM | POA: Diagnosis not present

## 2016-08-16 DIAGNOSIS — R600 Localized edema: Secondary | ICD-10-CM | POA: Diagnosis not present

## 2016-09-19 ENCOUNTER — Ambulatory Visit
Admission: RE | Admit: 2016-09-19 | Discharge: 2016-09-19 | Disposition: A | Discharge status: Home / Self Care | Payer: Commercial Managed Care - HMO | Source: Ambulatory Visit | Attending: Internal Medicine | Admitting: Internal Medicine

## 2016-09-19 ENCOUNTER — Other Ambulatory Visit: Payer: Self-pay | Admitting: Internal Medicine

## 2016-09-19 DIAGNOSIS — R05 Cough: Secondary | ICD-10-CM | POA: Diagnosis not present

## 2016-09-19 DIAGNOSIS — R059 Cough, unspecified: Secondary | ICD-10-CM

## 2016-12-17 DIAGNOSIS — I10 Essential (primary) hypertension: Secondary | ICD-10-CM | POA: Diagnosis not present

## 2016-12-17 DIAGNOSIS — J309 Allergic rhinitis, unspecified: Secondary | ICD-10-CM | POA: Diagnosis not present

## 2016-12-17 DIAGNOSIS — G629 Polyneuropathy, unspecified: Secondary | ICD-10-CM | POA: Diagnosis not present

## 2016-12-17 DIAGNOSIS — K5900 Constipation, unspecified: Secondary | ICD-10-CM | POA: Diagnosis not present

## 2016-12-17 DIAGNOSIS — I6340 Cerebral infarction due to embolism of unspecified cerebral artery: Secondary | ICD-10-CM | POA: Diagnosis not present

## 2016-12-17 DIAGNOSIS — H9313 Tinnitus, bilateral: Secondary | ICD-10-CM | POA: Diagnosis not present

## 2016-12-17 DIAGNOSIS — M19031 Primary osteoarthritis, right wrist: Secondary | ICD-10-CM | POA: Diagnosis not present

## 2016-12-17 DIAGNOSIS — K219 Gastro-esophageal reflux disease without esophagitis: Secondary | ICD-10-CM | POA: Diagnosis not present

## 2016-12-17 DIAGNOSIS — H9190 Unspecified hearing loss, unspecified ear: Secondary | ICD-10-CM | POA: Diagnosis not present

## 2017-01-27 DIAGNOSIS — M65351 Trigger finger, right little finger: Secondary | ICD-10-CM | POA: Diagnosis not present

## 2017-01-27 DIAGNOSIS — R51 Headache: Secondary | ICD-10-CM | POA: Diagnosis not present

## 2017-01-27 DIAGNOSIS — K047 Periapical abscess without sinus: Secondary | ICD-10-CM | POA: Diagnosis not present

## 2017-03-18 ENCOUNTER — Other Ambulatory Visit: Payer: Self-pay | Admitting: Internal Medicine

## 2017-03-18 DIAGNOSIS — R109 Unspecified abdominal pain: Secondary | ICD-10-CM

## 2017-03-18 DIAGNOSIS — J309 Allergic rhinitis, unspecified: Secondary | ICD-10-CM | POA: Diagnosis not present

## 2017-03-18 DIAGNOSIS — R51 Headache: Secondary | ICD-10-CM | POA: Diagnosis not present

## 2017-03-20 ENCOUNTER — Ambulatory Visit
Admission: RE | Admit: 2017-03-20 | Discharge: 2017-03-20 | Disposition: A | Discharge status: Home / Self Care | Payer: Medicare HMO | Source: Ambulatory Visit | Attending: Internal Medicine | Admitting: Internal Medicine

## 2017-03-20 DIAGNOSIS — R109 Unspecified abdominal pain: Secondary | ICD-10-CM

## 2017-03-20 DIAGNOSIS — K228 Other specified diseases of esophagus: Secondary | ICD-10-CM | POA: Diagnosis not present

## 2017-03-31 ENCOUNTER — Ambulatory Visit
Admission: RE | Admit: 2017-03-31 | Discharge: 2017-03-31 | Disposition: A | Discharge status: Home / Self Care | Payer: Medicare HMO | Source: Ambulatory Visit | Attending: Internal Medicine | Admitting: Internal Medicine

## 2017-03-31 ENCOUNTER — Other Ambulatory Visit: Payer: Self-pay | Admitting: Internal Medicine

## 2017-03-31 DIAGNOSIS — M549 Dorsalgia, unspecified: Secondary | ICD-10-CM

## 2017-03-31 DIAGNOSIS — J309 Allergic rhinitis, unspecified: Secondary | ICD-10-CM | POA: Diagnosis not present

## 2017-03-31 DIAGNOSIS — M47816 Spondylosis without myelopathy or radiculopathy, lumbar region: Secondary | ICD-10-CM | POA: Diagnosis not present

## 2017-03-31 DIAGNOSIS — R51 Headache: Secondary | ICD-10-CM | POA: Diagnosis not present

## 2017-03-31 DIAGNOSIS — R109 Unspecified abdominal pain: Secondary | ICD-10-CM | POA: Diagnosis not present

## 2017-06-17 IMAGING — MG 2D DIGITAL SCREENING BILATERAL MAMMOGRAM WITH CAD AND ADJUNCT TO
8 of 14 series · 8 of 30 positions shown · non-contrast
Comparison: Previous exam(s).

CLINICAL DATA: Screening.

EXAM:
2D DIGITAL SCREENING BILATERAL MAMMOGRAM WITH CAD AND ADJUNCT TOMO

[L MLO (1 of 2)]
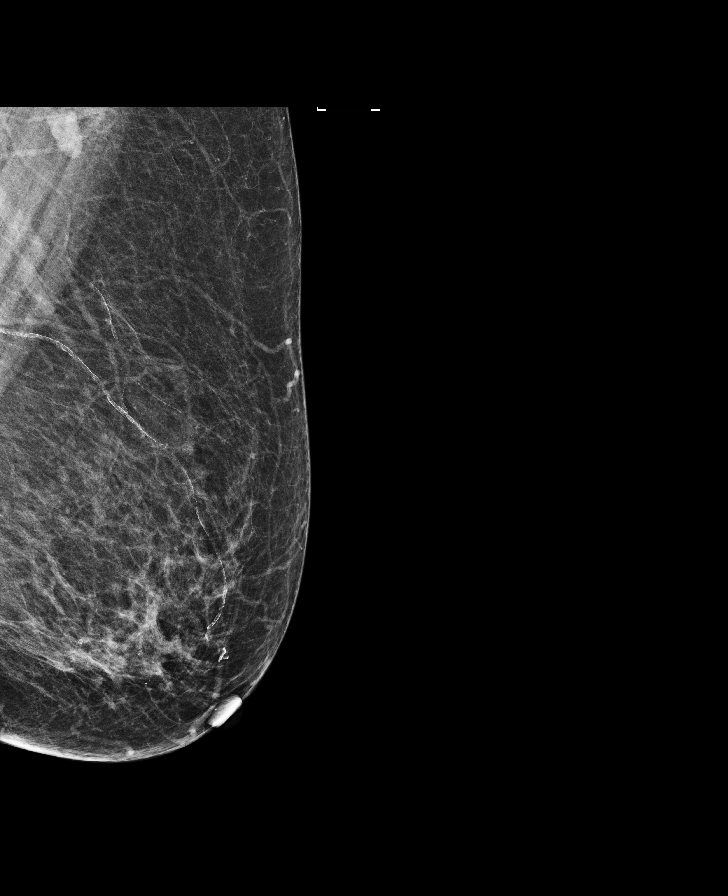

[R MLO (1 of 2)]
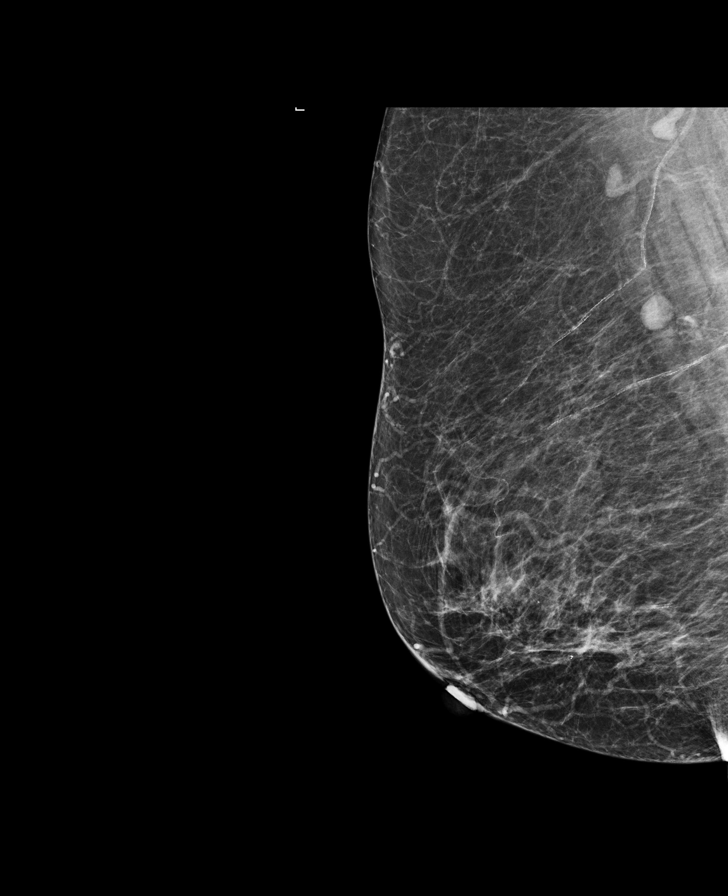

[L CC synth-2D]
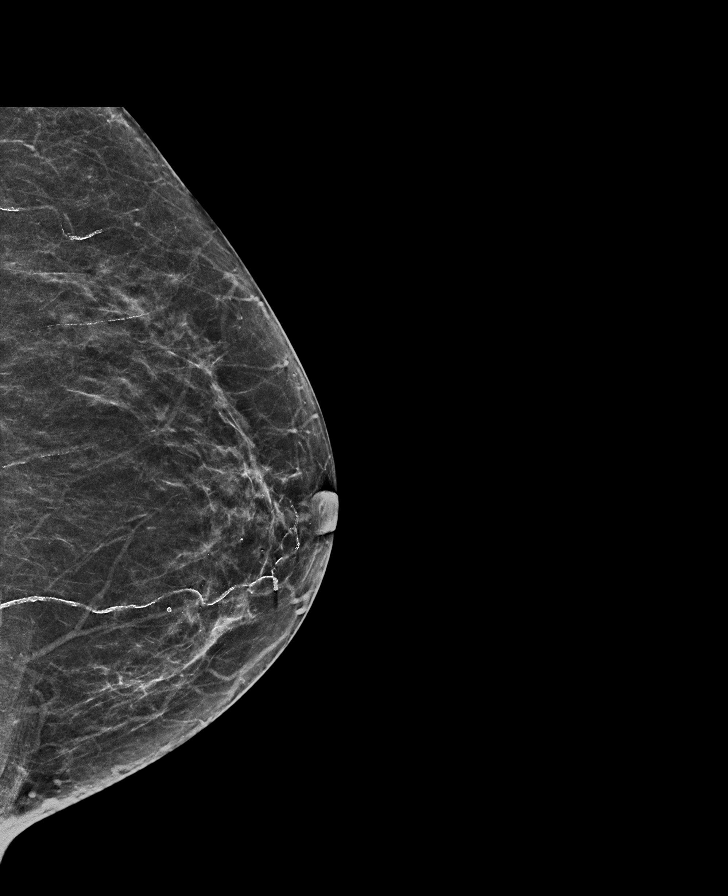

[L CC]
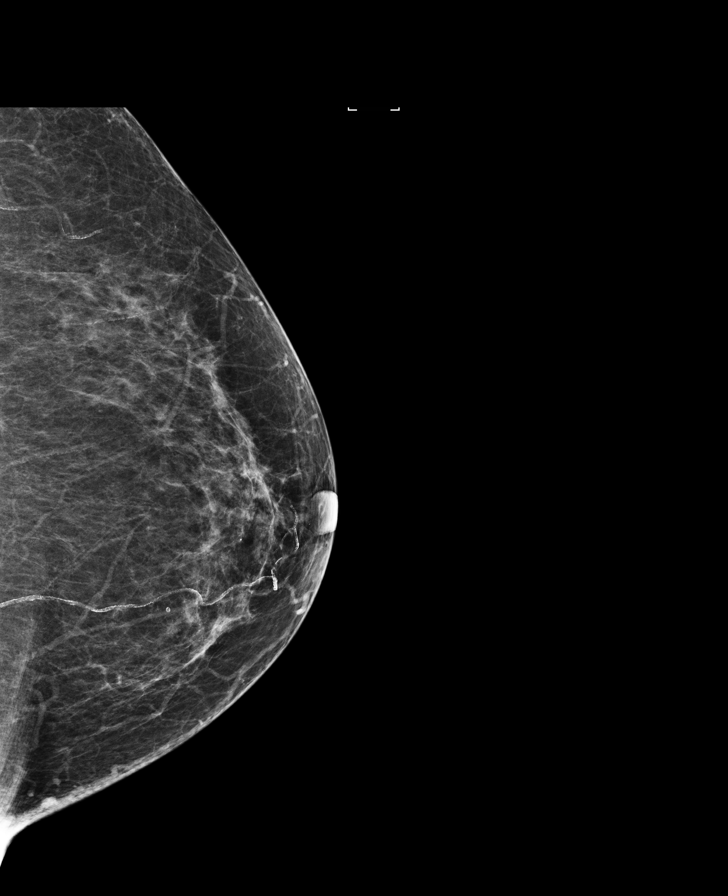

[L MLO (2 of 2)]
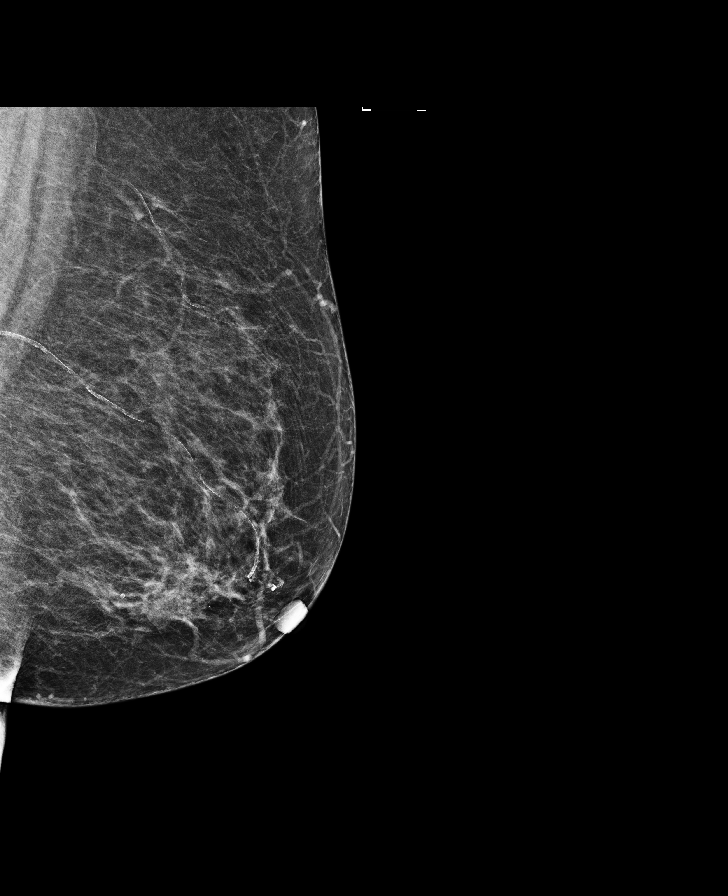

[R CC synth-2D]
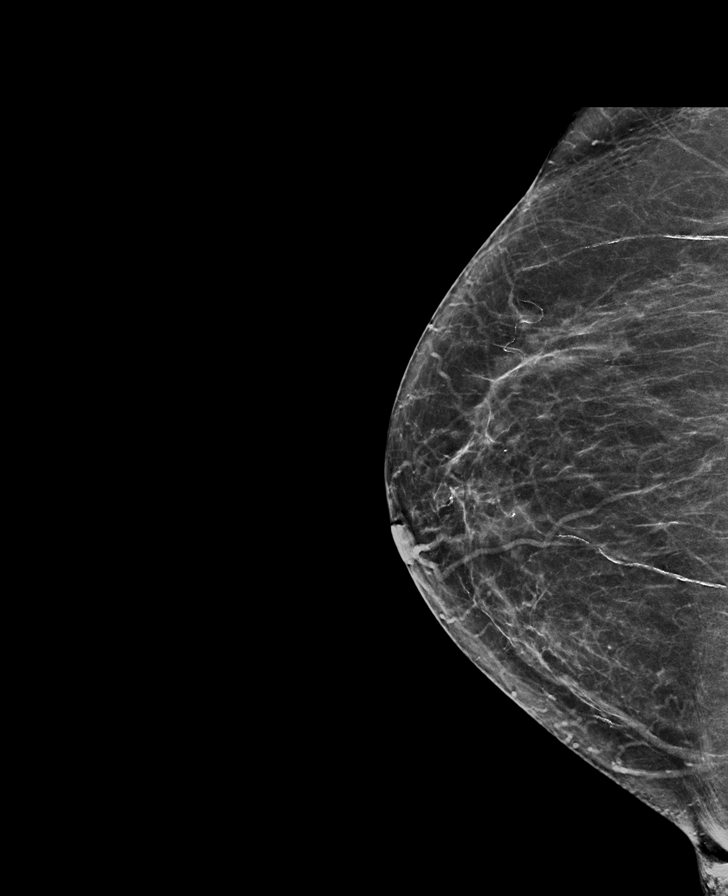

[L MLO synth-2D]
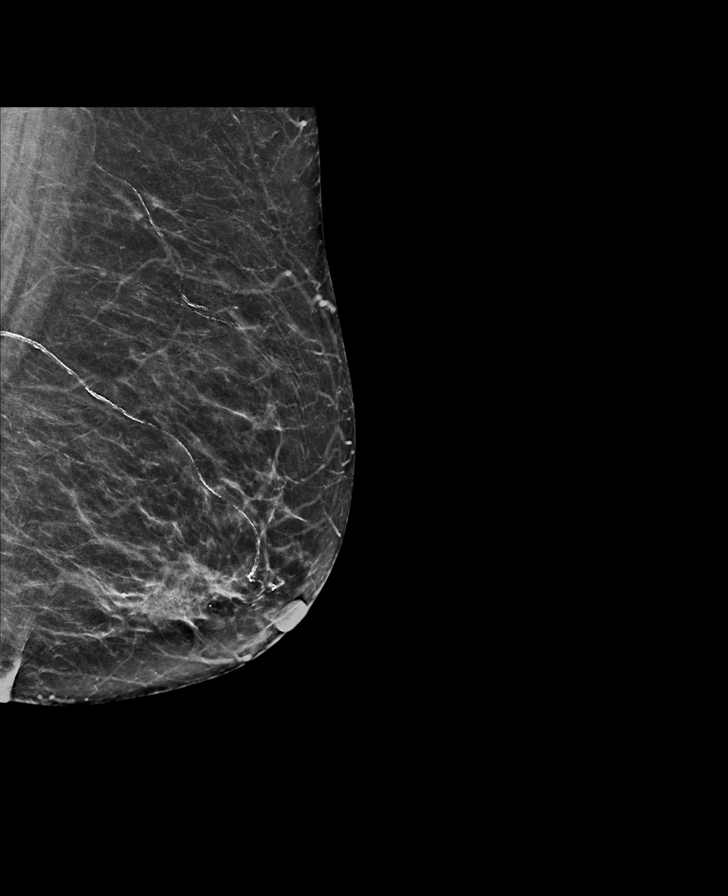

[R MLO (2 of 2)]
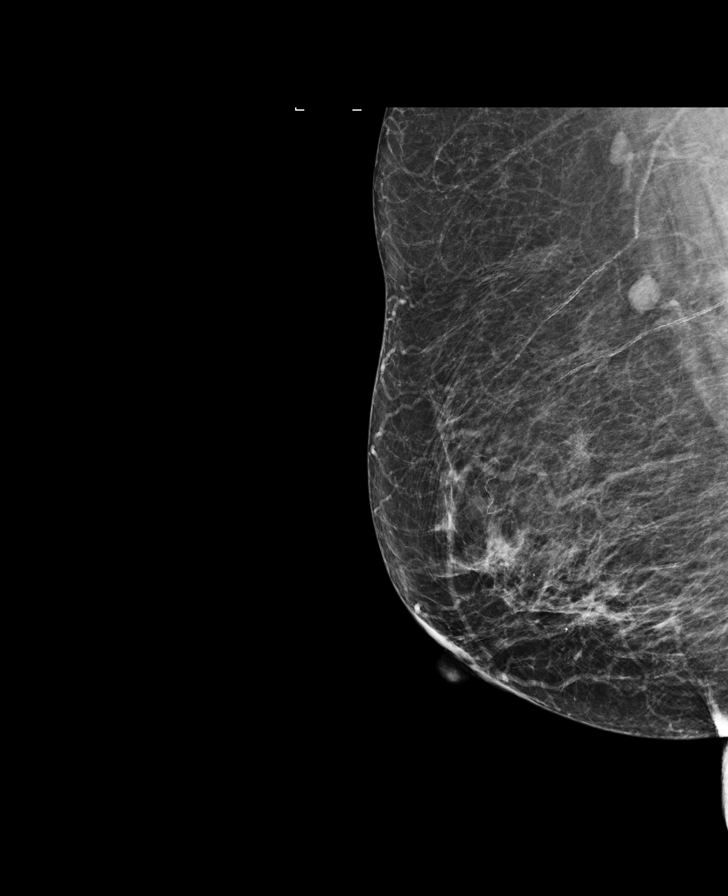

[8 of 30 positions shown; findings below may reference images not displayed]

ACR Breast Density Category b: There are scattered areas of
fibroglandular density.
FINDINGS: There are no findings suspicious for malignancy. Images were
processed with CAD.
IMPRESSION: No mammographic evidence of malignancy. A result letter of this
screening mammogram will be mailed directly to the patient.

RECOMMENDATION:
Screening mammogram in one year. (Code:97-6-RS4)

BI-RADS CATEGORY  1: Negative.

## 2017-07-16 ENCOUNTER — Other Ambulatory Visit: Payer: Self-pay | Admitting: Internal Medicine

## 2017-07-16 DIAGNOSIS — R221 Localized swelling, mass and lump, neck: Secondary | ICD-10-CM | POA: Diagnosis not present

## 2017-07-16 DIAGNOSIS — J45991 Cough variant asthma: Secondary | ICD-10-CM | POA: Diagnosis not present

## 2017-07-16 DIAGNOSIS — Z1389 Encounter for screening for other disorder: Secondary | ICD-10-CM | POA: Diagnosis not present

## 2017-07-16 DIAGNOSIS — Z7189 Other specified counseling: Secondary | ICD-10-CM | POA: Diagnosis not present

## 2017-07-16 DIAGNOSIS — G4700 Insomnia, unspecified: Secondary | ICD-10-CM | POA: Diagnosis not present

## 2017-07-16 DIAGNOSIS — M48061 Spinal stenosis, lumbar region without neurogenic claudication: Secondary | ICD-10-CM | POA: Diagnosis not present

## 2017-07-16 DIAGNOSIS — G609 Hereditary and idiopathic neuropathy, unspecified: Secondary | ICD-10-CM | POA: Diagnosis not present

## 2017-07-16 DIAGNOSIS — Z0000 Encounter for general adult medical examination without abnormal findings: Secondary | ICD-10-CM | POA: Diagnosis not present

## 2017-07-16 DIAGNOSIS — I679 Cerebrovascular disease, unspecified: Secondary | ICD-10-CM

## 2017-07-16 DIAGNOSIS — K219 Gastro-esophageal reflux disease without esophagitis: Secondary | ICD-10-CM | POA: Diagnosis not present

## 2017-07-16 DIAGNOSIS — I6340 Cerebral infarction due to embolism of unspecified cerebral artery: Secondary | ICD-10-CM | POA: Diagnosis not present

## 2017-07-16 DIAGNOSIS — E782 Mixed hyperlipidemia: Secondary | ICD-10-CM | POA: Diagnosis not present

## 2017-07-21 ENCOUNTER — Ambulatory Visit
Admission: RE | Admit: 2017-07-21 | Discharge: 2017-07-21 | Disposition: A | Discharge status: Home / Self Care | Payer: Medicare HMO | Source: Ambulatory Visit | Attending: Internal Medicine | Admitting: Internal Medicine

## 2017-07-21 DIAGNOSIS — I6523 Occlusion and stenosis of bilateral carotid arteries: Secondary | ICD-10-CM | POA: Diagnosis not present

## 2017-07-21 DIAGNOSIS — I679 Cerebrovascular disease, unspecified: Secondary | ICD-10-CM

## 2017-09-01 ENCOUNTER — Other Ambulatory Visit: Payer: Self-pay | Admitting: Internal Medicine

## 2017-09-01 DIAGNOSIS — Z1231 Encounter for screening mammogram for malignant neoplasm of breast: Secondary | ICD-10-CM

## 2017-09-09 DIAGNOSIS — H918X3 Other specified hearing loss, bilateral: Secondary | ICD-10-CM | POA: Diagnosis not present

## 2017-09-09 DIAGNOSIS — I6340 Cerebral infarction due to embolism of unspecified cerebral artery: Secondary | ICD-10-CM | POA: Diagnosis not present

## 2017-09-09 DIAGNOSIS — G609 Hereditary and idiopathic neuropathy, unspecified: Secondary | ICD-10-CM | POA: Diagnosis not present

## 2017-09-09 DIAGNOSIS — M48061 Spinal stenosis, lumbar region without neurogenic claudication: Secondary | ICD-10-CM | POA: Diagnosis not present

## 2017-09-09 DIAGNOSIS — I10 Essential (primary) hypertension: Secondary | ICD-10-CM | POA: Diagnosis not present

## 2017-09-09 DIAGNOSIS — M2550 Pain in unspecified joint: Secondary | ICD-10-CM | POA: Diagnosis not present

## 2017-09-19 ENCOUNTER — Inpatient Hospital Stay: Admission: RE | Admit: 2017-09-19 | Payer: Medicare HMO | Source: Ambulatory Visit

## 2017-09-24 DIAGNOSIS — Z780 Asymptomatic menopausal state: Secondary | ICD-10-CM | POA: Diagnosis not present

## 2017-09-24 DIAGNOSIS — M8588 Other specified disorders of bone density and structure, other site: Secondary | ICD-10-CM | POA: Diagnosis not present

## 2017-11-26 ENCOUNTER — Ambulatory Visit: Payer: Medicare HMO

## 2017-12-24 NOTE — Progress Notes (Deleted)
   Office Visit Note  Patient: Rachael Buckley             Date of Birth: 05-30-1931           MRN: [id number]             PCP: Marden NobleGates, Robert, MD Referring: Marden NobleGates, Robert, MD Visit Date: 01/02/2018 Occupation: @GUAROCC @    Subjective:  No chief complaint on file.   History of Present Illness: Rachael CortiWillie M Goga is a 84 y.o. female ***   Activities of Daily Living:  Patient reports morning stiffness for *** {minute/hour:19697}.   Patient {ACTIONS;DENIES/REPORTS:21021675::"Denies"} nocturnal pain.  Difficulty dressing/grooming: {ACTIONS;DENIES/REPORTS:21021675::"Denies"} Difficulty climbing stairs: {ACTIONS;DENIES/REPORTS:21021675::"Denies"} Difficulty getting out of chair: {ACTIONS;DENIES/REPORTS:21021675::"Denies"} Difficulty using hands for taps, buttons, cutlery, and/or writing: {ACTIONS;DENIES/REPORTS:21021675::"Denies"}   No Rheumatology ROS completed.   PMFS History:  There are no active problems to display for this patient.   Past Medical History:  Diagnosis Date  . Headache(784.0)    tension now, migraines 1970's  . Stroke 2001   tia 2011, balance problems, right leg numb at times    No family history on file. Past Surgical History:  Procedure Laterality Date  . ABDOMINAL HYSTERECTOMY    . BACK SURGERY  1980's   lower  . COLONOSCOPY WITH PROPOFOL N/A 04/05/2014   Procedure: COLONOSCOPY WITH PROPOFOL;  Surgeon: Charolett BumpersMartin K Johnson, MD;  Location: [facility];  Service: Endoscopy;  Laterality: N/A;  . ESOPHAGOGASTRODUODENOSCOPY (EGD) WITH PROPOFOL N/A 04/05/2014   Procedure: ESOPHAGOGASTRODUODENOSCOPY (EGD) WITH PROPOFOL;  Surgeon: Charolett BumpersMartin K Johnson, MD;  Location: [facility];  Service: Endoscopy;  Laterality: N/A;  . TONSILLECTOMY  yrs ago   Social History   Social History Narrative  . Not on file     Objective: Vital Signs: There were no vitals taken for this visit.   Physical Exam   Musculoskeletal Exam: ***  CDAI Exam: No CDAI exam completed.     Investigation: No additional findings.   Imaging: No results found.  Speciality Comments: No specialty comments available.    Procedures:  No procedures performed Allergies: Patient has no known allergies.   Assessment / Plan:     Visit Diagnoses: No diagnosis found.    Orders: No orders of the defined types were placed in this encounter.  No orders of the defined types were placed in this encounter.   Face-to-face time spent with patient was *** minutes. 50% of time was spent in counseling and coordination of care.  Follow-Up Instructions: No Follow-up on file.   Ellen HenriMarissa C Loyal Holzheimer, CMA  Note - This record has been created using Animal nutritionistDragon software.  Chart creation errors have been sought, but may not always  have been located. Such creation errors do not reflect on  the standard of medical care.

## 2018-01-02 ENCOUNTER — Ambulatory Visit: Payer: Self-pay | Admitting: Rheumatology

## 2018-03-05 DIAGNOSIS — I69993 Ataxia following unspecified cerebrovascular disease: Secondary | ICD-10-CM | POA: Diagnosis not present

## 2018-03-24 ENCOUNTER — Encounter: Payer: Self-pay | Admitting: Neurology

## 2018-03-24 ENCOUNTER — Ambulatory Visit: Payer: Medicare HMO | Admitting: Neurology

## 2018-03-24 VITALS — BP 146/63 | HR 67 | Ht 66.0 in | Wt 156.0 lb

## 2018-03-24 DIAGNOSIS — Z8673 Personal history of transient ischemic attack (TIA), and cerebral infarction without residual deficits: Secondary | ICD-10-CM | POA: Diagnosis not present

## 2018-03-24 DIAGNOSIS — R2681 Unsteadiness on feet: Secondary | ICD-10-CM

## 2018-03-24 NOTE — Patient Instructions (Addendum)
-   continue ASA for stroke prevention - will do nerve conduction study to rule out neuropathy - will refer to PT/OT for balance training.  - Follow up with your primary care physician for stroke risk factor modification. Recommend maintain blood pressure goal <130/80, diabetes with hemoglobin A1c goal below 7.0% and lipids with LDL cholesterol goal below 70 mg/dL.  - check BP at home and record - walk slowly, keep balance, and avoid fall. - follow up in 2 months with Shanda Bumps.

## 2018-03-25 DIAGNOSIS — Z8673 Personal history of transient ischemic attack (TIA), and cerebral infarction without residual deficits: Secondary | ICD-10-CM | POA: Insufficient documentation

## 2018-03-25 DIAGNOSIS — R2681 Unsteadiness on feet: Secondary | ICD-10-CM | POA: Insufficient documentation

## 2018-03-25 NOTE — Progress Notes (Signed)
NEUROLOGY CLINIC NEW PATIENT NOTE  NAME: Rachael Buckley Buckley DOB: May 06, 1931 REFERRING PHYSICIAN: Marden Noble, MD  I saw Rachael Buckley Buckley as a new consult in the neurovascular clinic today regarding  Chief Complaint  Patient presents with  . New Patient (Initial Visit)    Referral from Dr. Marden Noble for Ataxia history of strokes  .  HPI: Rachael Buckley Buckley is a 84 y.o. female with PMH of stroke in 07/2003 who presents as a new patient for unsteady gait.   Pt had stroke in 2004 and was treated by Dr. Sandria Manly and Dr. Anne Hahn. She presented with acute onset of speech difficulty consistent with aphasia. CT no acute abnormality. MRI showed acute left posterior frontal infarction. MRA mild intracranial stenosis b/l distal MCA branches. CUS and TCD neg. EF 55-60%. However, TEE suggestive of LAA thrombus that was somewhat pedunculated. She was started on coumdain with heparin bridge. However, she lost follow up with [facility] after.  As per pt, she continued coumadin for 7 years and then stopped. Currently on ASA. But she said she had some "TIAs" over the years. She also stated that she has had balance issues for a good while. About one year ago, she was given meclizine but she could not tolerate it. The balance was not bad at all. No falls. About 3 weeks ago, she was taking a class and was standing at that time. She turned over her body so that she can see the instructor. She turned so fast and she fell. She also stated that she feels worsening balance, sometimes leaning towards either left or right. She denies any pain in legs or back, no weakness or numbness. She takes neurontin at night for neuropathy. She went to PCP and was referred here for evaluation.    Past Medical History:  Diagnosis Date  . Alopecia   . Chronic arthritis    of the wrists  . CVA (cerebral vascular accident) (HCC)   . GERD (gastroesophageal reflux disease)   . Headache(784.0)    tension now, migraines 1970's  . Hearing loss   .  Lumbar radiculopathy   . Raynaud's phenomenon   . Stroke [facility]) 2001   tia 2011, balance problems, right leg numb at times   Past Surgical History:  Procedure Laterality Date  . ABDOMINAL HYSTERECTOMY    . BACK SURGERY  1980's   lower  . cholescystectomy    . COLONOSCOPY WITH PROPOFOL N/A 04/05/2014   Procedure: COLONOSCOPY WITH PROPOFOL;  Surgeon: Charolett Bumpers, MD;  Location: [facility];  Service: Endoscopy;  Laterality: N/A;  . ESOPHAGOGASTRODUODENOSCOPY (EGD) WITH PROPOFOL N/A 04/05/2014   Procedure: ESOPHAGOGASTRODUODENOSCOPY (EGD) WITH PROPOFOL;  Surgeon: Charolett Bumpers, MD;  Location: [facility];  Service: Endoscopy;  Laterality: N/A;  . TONSILLECTOMY  yrs ago   History reviewed. No pertinent family history. Current Outpatient Medications  Medication Sig Dispense Refill  . acetaminophen (TYLENOL) 500 MG tablet Take 500 mg by mouth every 6 (six) hours as needed.    [name] aspirin 325 MG tablet Take 81 mg by mouth daily.     . diphenhydrAMINE (BENADRYL) 25 mg capsule Take 50 mg by mouth every morning.    . gabapentin (NEURONTIN) 300 MG capsule Take 300 mg by mouth at bedtime.    . hydrochlorothiazide (HYDRODIURIL) 12.5 MG tablet Take 12.5 mg by mouth daily.    [name] loratadine (CLARITIN) 10 MG tablet Take 10 mg by mouth daily.    [name] losartan-hydrochlorothiazide (HYZAAR) 50-12.5 MG tablet     .  Multiple Vitamin (MULTIVITAMIN WITH MINERALS) TABS tablet Take 1 tablet by mouth daily.    . pantoprazole (PROTONIX) 40 MG tablet Take 40 mg by mouth as needed.     . triamcinolone cream (KENALOG) 0.1 %      No current facility-administered medications for this visit.    Allergies  Allergen Reactions  . Morphine And Related     rash  . Prednisone     Severe tremors  . Valium [Diazepam]     depression  . Zocor [Simvastatin]     Short of breath   Social History   Socioeconomic History  . Marital status: Widowed    Spouse name: Not on file  . Number of children: Not on file  .  Years of education: Not on file  . Highest education level: Not on file  Occupational History  . Not on file  Social Needs  . Financial resource strain: Not on file  . Food insecurity:    Worry: Not on file    Inability: Not on file  . Transportation needs:    Medical: Not on file    Non-medical: Not on file  Tobacco Use  . Smoking status: Never Smoker  . Smokeless tobacco: Never Used  Substance and Sexual Activity  . Alcohol use: No  . Drug use: No  . Sexual activity: Not on file  Lifestyle  . Physical activity:    Days per week: Not on file    Minutes per session: Not on file  . Stress: Not on file  Relationships  . Social connections:    Talks on phone: Not on file    Gets together: Not on file    Attends religious service: Not on file    Active member of club or organization: Not on file    Attends meetings of clubs or organizations: Not on file    Relationship status: Not on file  . Intimate partner violence:    Fear of current or ex partner: Not on file    Emotionally abused: Not on file    Physically abused: Not on file    Forced sexual activity: Not on file  Other Topics Concern  . Not on file  Social History Narrative  . Not on file    Review of Systems Full 14 system review of systems performed and notable only for those listed, all others are neg:  Constitutional:   Cardiovascular:  Ear/Nose/Throat:  Hearing loss, ringing in ears Skin:  Eyes:   Respiratory:  SOB, cough Gastroitestinal:   Genitourinary:  Hematology/Lymphatic:   Endocrine: feeling cold Musculoskeletal:  Joint pain, cramps, aching muscles Allergy/Immunology:  allergies Neurological:  HA Psychiatric:  Sleep:    Physical Exam  Vitals:   03/24/18 1456  BP: (!) 146/63  Pulse: 67    General - Well nourished, well developed, in no apparent distress.  Ophthalmologic - fundi not visualized due to eye movement  Cardiovascular - Regular rate and rhythm.   Neck - supple, no  nuchal rigidity.  Mental Status -  Level of arousal and orientation to time, place, and person were intact. Language including expression, naming, repetition, comprehension, reading, and writing was assessed and found intact. Fund of Knowledge was assessed and was intact.  Cranial Nerves II - XII - II - Visual field intact OU. III, IV, VI - Extraocular movements intact. V - Facial sensation intact bilaterally. VII - Facial movement intact bilaterally. VIII - Hearing & vestibular intact bilaterally. X - Palate elevates  symmetrically. XI - Chin turning & shoulder shrug intact bilaterally. XII - Tongue protrusion intact.  Motor Strength - The patient's strength was normal in all extremities and pronator drift was absent.  Bulk was normal and fasciculations were absent.   Motor Tone - Muscle tone was assessed at the neck and appendages and was normal.  Reflexes - The patient's reflexes were 1+ in all extremities and she had no pathological reflexes.  Sensory - Light touch, temperature/pinprick, vibration and proprioception were assessed and were decreased bilateral LEs.   Coordination - The patient had normal movements in the hands and feet with no ataxia or dysmetria.  Tremor was absent.  Gait and Station - small stride, but steady   Imaging none  Lab Review none  Assessment and Plan:   In summary, Rachael Buckley Buckley is a 84 y.o. female with PMH of stroke in 2004 presents for mild gait imbalance. In 2004, she had aphasia and stroke work up positive for LAA thrombus. She was treated with coumadin for 7 years, no residue. She had mild gait balance over the years and had fall 3 weeks ago with abrupt turning. She is on neurontin for neuropathy. Exam did not show sign of cord compression. Will do EMG/NCS and refer to PT/OT.   - continue ASA for stroke prevention - EMG/NCS to revaluate neuropathy - will refer to PT/OT for balance training.  - Follow up with your primary care physician  for stroke risk factor modification. Recommend maintain blood pressure goal <130/80, diabetes with hemoglobin A1c goal below 7.0% and lipids with LDL cholesterol goal below 70 mg/dL.  - check BP at home and record - walk slowly, keep balance, and avoid fall. - follow up in 2 months with Shanda Bumps.   Thank you very much for the opportunity to participate in the care of this patient.  Please do not hesitate to call if any questions or concerns arise.  Orders Placed This Encounter  Procedures  . Ambulatory referral to Physical Therapy    Referral Priority:   Routine    Referral Type:   Physical Medicine    Referral Reason:   Specialty Services Required    Requested Specialty:   Physical Therapy    Number of Visits Requested:   1  . Ambulatory referral to Occupational Therapy    Referral Priority:   Routine    Referral Type:   Occupational Therapy    Referral Reason:   Specialty Services Required    Requested Specialty:   Occupational Therapy    Number of Visits Requested:   1  . NCV with EMG(electromyography)    Standing Status:   Future    Standing Expiration Date:   03/25/2019    Order Specific Question:   Where should this test be performed?    Answer:   [facility]    No orders of the defined types were placed in this encounter.   Patient Instructions  - continue ASA for stroke prevention - will do nerve conduction study to rule out neuropathy - will refer to PT/OT for balance training.  - Follow up with your primary care physician for stroke risk factor modification. Recommend maintain blood pressure goal <130/80, diabetes with hemoglobin A1c goal below 7.0% and lipids with LDL cholesterol goal below 70 mg/dL.  - check BP at home and record - walk slowly, keep balance, and avoid fall. - follow up in 2 months with Shanda Bumps.    Marvel Plan, MD PhD [facility] [phone]  [address], [place], [place] [address] [phone]

## 2018-04-16 ENCOUNTER — Ambulatory Visit (INDEPENDENT_AMBULATORY_CARE_PROVIDER_SITE_OTHER): Payer: Medicare HMO | Admitting: Diagnostic Neuroimaging

## 2018-04-16 ENCOUNTER — Encounter: Payer: Medicare HMO | Admitting: Diagnostic Neuroimaging

## 2018-04-16 DIAGNOSIS — R2681 Unsteadiness on feet: Secondary | ICD-10-CM

## 2018-04-16 DIAGNOSIS — Z0289 Encounter for other administrative examinations: Secondary | ICD-10-CM

## 2018-04-17 NOTE — Procedures (Signed)
[facility]  NCS (NERVE CONDUCTION STUDY) WITH EMG (ELECTROMYOGRAPHY) REPORT   STUDY DATE: 04/16/18 PATIENT NAME: Rachael Buckley DOB: 1930-12-04 MRN: [id number]  ORDERING CLINICIAN: Marvel Plan, MD PhD   TECHNOLOGIST: Gifford Shave handy ELECTROMYOGRAPHER: Glenford Bayley. Sanae Willetts, MD  CLINICAL INFORMATION: 84 year old female with balance difficulty and left leg numbness.  FINDINGS: NERVE CONDUCTION STUDY: Right peroneal and right tibial motor responses are normal.  Left peroneal motor response has normal distal latency, decreased amplitude, normal conduction velocity.  Left tibial motor response is normal.  Bilateral sural and superficial peroneal sensory responses are normal.  Bilateral tibial F wave latencies normal.   NEEDLE ELECTROMYOGRAPHY:  Needle examination of left lower extremity and left lumbar paraspinal muscles is normal.    IMPRESSION:   Abnormal study demonstrating: - Left lower extremity motor responses are slightly abnormal, raising possibilities of left lower extremity motor neuropathies versus left lumbar radiculopathies.  Needle examination does not further localize. -Right lower extremity is unremarkable.   INTERPRETING PHYSICIAN:  Suanne Marker, MD Certified in Neurology, Neurophysiology and Neuroimaging  [facility] [address], [address] [place], [place] [address] [phone]   [facility]    Nerve / Sites Muscle Latency Ref. Amplitude Ref. Rel Amp Segments Distance Velocity Ref. Area    ms ms mV mV %  cm m/s m/s mVms  R Peroneal - EDB     Ankle EDB 3.9 ?6.5 5.6 ?2.0 100 Ankle - EDB 9   16.1     Fib head EDB 9.9  4.8  84.6 Fib head - Ankle 27 45 ?44 15.5     Pop fossa EDB 12.0  4.9  104 Pop fossa - Fib head 10 47 ?44 18.8         Pop fossa - Ankle      L Peroneal - EDB     Ankle EDB 4.5 ?6.5 1.6 ?2.0 100 Ankle - EDB 9   4.6     Fib head EDB 10.3  1.4  86.2 Fib head - Ankle 27 46 ?44 4.6     Pop fossa EDB  12.4  1.4  101 Pop fossa - Fib head 10 48 ?44 4.5         Pop fossa - Ankle      R Tibial - AH     Ankle AH 4.0 ?5.8 9.8 ?4.0 100 Ankle - AH 9   18.8     Pop fossa AH 12.7  6.3  64.7 Pop fossa - Ankle 37 43 ?41 15.8  L Tibial - AH     Ankle AH 5.0 ?5.8 4.4 ?4.0 100 Ankle - AH 9   9.1     Pop fossa AH 13.4  3.1  70.2 Pop fossa - Ankle 37 44 ?41 5.6             SNC    Nerve / Sites Rec. Site Peak Lat Ref.  Amp Ref. Segments Distance    ms ms V V  cm  R Sural - Ankle (Calf)     Calf Ankle 3.4 ?4.4 9 ?6 Calf - Ankle 14  L Sural - Ankle (Calf)     Calf Ankle 3.5 ?4.4 6 ?6 Calf - Ankle 14  R Superficial peroneal - Ankle     Lat leg Ankle 4.0 ?4.4 6 ?6 Lat leg - Ankle 14  L Superficial peroneal - Ankle     Lat leg Ankle 3.8 ?4.4 6 ?6 Lat leg - Ankle 14  F  Wave    Nerve F Lat Ref.   ms ms  R Tibial - AH 50.2 ?56.0  L Tibial - AH 51.0 ?56.0         EMG full       EMG Summary Table    Spontaneous MUAP Recruitment  Muscle IA Fib PSW Fasc Other Amp Dur. Poly Pattern  L. Tibialis anterior Normal None None None _______ Normal Normal Normal Normal  L. Gastrocnemius (Medial head) Normal None None None _______ Normal Normal Normal Normal  L. Peroneus longus Normal None None None _______ Normal Normal Normal Normal  L. Vastus medialis Normal None None None _______ Normal Normal Normal Normal  L. Lumbar paraspinals Normal None None None _______ Normal Normal Normal Normal

## 2018-04-20 ENCOUNTER — Telehealth: Payer: Self-pay

## 2018-04-20 NOTE — Telephone Encounter (Signed)
-----   Message from Jindong Xu, MD sent at 04/17/2018 10:11 PM EDT ----- Could you please let the patient know that the nerve conduction study done recently in our office was consistent with neuropathy on the left leg, unremarkable for the right leg. Please continue neurontin and PT/OT rehab. Thanks.  Jindong Xu, MD PhD Stroke Neurology 04/17/2018 10:11 PM   

## 2018-04-20 NOTE — Telephone Encounter (Signed)
-----   Message from Marvel PlanJindong Xu, MD sent at 04/17/2018 10:11 PM EDT ----- Could you please let the patient know that the nerve conduction study done recently in our office was consistent with neuropathy on the left leg, unremarkable for the right leg. Please continue neurontin and PT/OT rehab. Thanks.  Marvel PlanJindong Xu, MD PhD Stroke Neurology 04/17/2018 10:11 PM

## 2018-04-20 NOTE — Telephone Encounter (Signed)
Notes recorded by Hildred AlaminMurrell, Machell Wirthlin Y, RN on 04/20/2018 at 8:17 AM EDT Tried to call phone just rang. NO vm was available. ------

## 2018-04-20 NOTE — Telephone Encounter (Signed)
Notes recorded by Hildred AlaminMurrell, Katrina Y, RN on 04/20/2018 at 4:53 PM EDT Rn call patient that her nerve conduction study was consistent with neuropathy on left leg. Unremarkable on right leg. Continue Neurontin PT and OT. PT verbalized understanding. Pt stated she was never call to schedule therapy appt. Rn stated the referral was sent last month. Rn gave pt neuro rehab contact number to schedule appt. ------

## 2018-04-28 ENCOUNTER — Ambulatory Visit: Payer: Medicare HMO | Attending: Neurology | Admitting: Physical Therapy

## 2018-04-28 ENCOUNTER — Other Ambulatory Visit: Payer: Self-pay

## 2018-04-28 ENCOUNTER — Encounter: Payer: Self-pay | Admitting: Physical Therapy

## 2018-04-28 DIAGNOSIS — R262 Difficulty in walking, not elsewhere classified: Secondary | ICD-10-CM | POA: Diagnosis not present

## 2018-04-28 DIAGNOSIS — R42 Dizziness and giddiness: Secondary | ICD-10-CM | POA: Insufficient documentation

## 2018-04-28 DIAGNOSIS — M6281 Muscle weakness (generalized): Secondary | ICD-10-CM | POA: Insufficient documentation

## 2018-04-28 NOTE — Therapy (Signed)
[facility] [address] [place], [place], [address] Phone: [phone]   Fax:  [phone]  Physical Therapy Evaluation  Patient Details  Name: Rachael Buckley MRN: [id number] Date of Birth: 03-10-1931 Referring Provider: Marvel Plan, MD   Encounter Date: 04/28/2018  PT End of Session - 04/28/18 1801    Visit Number  1    Number of Visits  7 eval plus 6 treatment visits    Date for PT Re-Evaluation  06/05/18 due to delayed start + 2 week interruption in schedule    Authorization Type  [employer]    Authorization Time Period  04/28/18 to 07/27/2018    PT Start Time  1315    PT Stop Time  1400    PT Time Calculation (min)  45 min    Activity Tolerance  Patient tolerated treatment well    Behavior During Therapy  [facility] for tasks assessed/performed       Past Medical History:  Diagnosis Date  . Alopecia   . Chronic arthritis    of the wrists  . CVA (cerebral vascular accident) (HCC)   . GERD (gastroesophageal reflux disease)   . Headache(784.0)    tension now, migraines 1970's  . Hearing loss   . Lumbar radiculopathy   . Raynaud's phenomenon   . Stroke [facility]) 2001   tia 2011, balance problems, right leg numb at times    Past Surgical History:  Procedure Laterality Date  . ABDOMINAL HYSTERECTOMY    . BACK SURGERY  1980's   lower  . cholescystectomy    . COLONOSCOPY WITH PROPOFOL N/A 04/05/2014   Procedure: COLONOSCOPY WITH PROPOFOL;  Surgeon: Charolett Bumpers, MD;  Location: [facility];  Service: Endoscopy;  Laterality: N/A;  . ESOPHAGOGASTRODUODENOSCOPY (EGD) WITH PROPOFOL N/A 04/05/2014   Procedure: ESOPHAGOGASTRODUODENOSCOPY (EGD) WITH PROPOFOL;  Surgeon: Charolett Bumpers, MD;  Location: [facility];  Service: Endoscopy;  Laterality: N/A;  . TONSILLECTOMY  yrs ago    There were no vitals filed for this visit.   Subjective Assessment - 04/28/18 1317    Subjective  I had a stroke in 2004 and my  doctor feels I am having problems from my stroke now. My balance for one. 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 I am off-balance. It's been worse for ~12 months.     Patient Stated Goals  improve my balance    Currently in Pain?  No/denies          [facility] PT Assessment - 04/28/18 1343      Assessment   Medical Diagnosis  Gait instability    Referring Provider  Marvel Plan, MD    Onset Date/Surgical Date  -- 03/24/18 referral by MD    Prior Therapy  for rt shoulder after a fall      Precautions   Precautions  Fall      Restrictions   Weight Bearing Restrictions  No      Balance Screen   Has the patient fallen in the past 6 months  Yes    How many times?  1    Has the patient had a decrease in activity level because of a fear of falling?   No    Is the patient reluctant to leave their home because of a fear of falling?   No      Home Public house manager  residence    Living Arrangements  Children    Available Help at Discharge  Family;Available 24 hours/day    Type of Home  House    Home Access  Stairs to enter    Entrance Stairs-Number of Steps  3    Entrance Stairs-Rails  Right;Left;Can reach both    Home Layout  Laundry or work area in basement;Two level    Alternate Level Stairs-Number of Steps  13    Alternate Level Stairs-Rails  Right    Home Equipment  None      Prior Function   Level of Independence  Independent    Vocation  Retired    Leisure  is in school to get her GED; exercises 1-1.5 hours every day (cycle and weight machines), sewing, hanging wallpaper, Therapist, art   Overall Cognitive Status  Within Functional Limits for tasks assessed      Sensation   Semmes Weinstein Monofilament Scale  Normal bil feet detected 10 of 10 sites      Coordination   Gross Motor Movements are Fluid and Coordinated  Yes    Fine Motor Movements are Fluid and  Coordinated  Yes      AROM   Overall AROM   Within functional limits for tasks performed      Strength   Overall Strength  Deficits    Overall Strength Comments  bil LE: hip flexion 4, hip abdct 4, knee ext 3+, knee flex 3+, ankle DF 4;       Transfers   Five time sit to stand comments   15.3 norm for age 81.2      Ambulation/Gait   Ambulation/Gait  Yes    Ambulation/Gait Assistance  6: Modified independent (Device/Increase time)    Ambulation Distance (Feet)  40 Feet 75, 80,     Assistive device  None    Gait Pattern  Step-through pattern;Decreased stride length;Decreased trunk rotation;Wide base of support    Ambulation Surface  Indoor    Gait velocity  32.8/13.1=2.50    Stairs  Yes    Stairs Assistance  6: Modified independent (Device/Increase time)    Stair Management Technique  One rail Right;Alternating pattern;Forwards    Number of Stairs  4    Height of Stairs  6      Balance   Balance Assessed  Yes      High Level Balance   High Level Balance Comments  standing on foam; feet apart +incr sway; EC x 15 sec with excessive ant/post sway and touching walls behind her to catch her balance      Functional Gait  Assessment   Gait assessed   Yes    Gait Level Surface  Walks 20 ft, slow speed, abnormal gait pattern, evidence for imbalance or deviates 10-15 in outside of the 12 in walkway width. Requires more than 7 sec to ambulate 20 ft. 8.19    Change in Gait Speed  Makes only minor adjustments to walking speed, or accomplishes a change in speed with significant gait deviations, deviates 10-15 in outside the 12 in walkway width, or changes speed but loses balance but is able to recover and continue walking.    Gait with Horizontal Head Turns  Performs head turns smoothly with slight change in gait velocity (eg, minor disruption to smooth gait path), deviates 6-10 in outside 12 in walkway width, or uses an assistive device.    Gait with Vertical Head Turns  Performs head turns  with no change in gait. Deviates no more than 6 in outside 12 in walkway width.    Gait and Pivot Turn  Pivot turns safely within 3 sec and stops quickly with no loss of balance.    Step Over Obstacle  Is able to step over 2 stacked shoe boxes taped together (9 in total height) without changing gait speed. No evidence of imbalance.    Gait with Narrow Base of Support  Ambulates less than 4 steps heel to toe or cannot perform without assistance.    Gait with Eyes Closed  Walks 20 ft, uses assistive device, slower speed, mild gait deviations, deviates 6-10 in outside 12 in walkway width. Ambulates 20 ft in less than 9 sec but greater than 7 sec.    Ambulating Backwards  Walks 20 ft, slow speed, abnormal gait pattern, evidence for imbalance, deviates 10-15 in outside 12 in walkway width.    Steps  Alternating feet, must use rail.    Total Score  18    FGA comment:  <23 high fall risk                    Objective measurements completed on examination: See above findings.              PT Education - 04/28/18 1758    Education Details  results of PT eval and apparent decr function of her vestibular system (based on difficulty standing on foam with EC and walking with head turns); potential benefit of PT    Person(s) Educated  Patient    Methods  Explanation    Comprehension  Verbalized understanding          PT Long Term Goals - 04/28/18 1823      PT LONG TERM GOAL #1   Title  Patient will be independent with HEP for balance and strengthening LEs (Target 06/05/18)    Time  6 plan is 4 weeks of therapy; pt has 2 week hiatus after first week)    Period  Weeks    Status  New      PT LONG TERM GOAL #2   Title  Patient will improve FGA to >=23/30 to demonstrate improved balance and lesser fall risk.     Time  6    Period  Weeks    Status  New      PT LONG TERM GOAL #3   Title  Patient will improve gait velocity to >=2.62 ft/sec for safe community ambulation.      Time  6    Period  Weeks    Status  New      PT LONG TERM GOAL #4   Title  Patient will ambulate 500 ft modified independent over unlevel, paved outdoor surfaces (slopes, curbs included)    Time  6    Period  Weeks    Status  New             Plan - 04/28/18 1810    Clinical Impression Statement  Patient referred to PT due to gait instability resulting in a fall. Patient reports she has noticed a decline in her balance over the past ~12 months. She often feels "off balance" (especially in the morning). Her score of 18/30 on the FGA puts her in the high fall risk group (scores of <23/30). Imbalance was noted with walking with head turns, with narrow BOS, and with eyes closed (simulating low/no lighting). Patient can benefit  from PT to address the deficits listed below via the interventions listed below.     History and Personal Factors relevant to plan of care:  PMH-left posterior frontal stroke in 07/2003, neuropathy, arthritis, hearing loss, lumbar radiculpathy, EMG 04/16/18 abnormal motor response LLE    Clinical Presentation  Evolving    Clinical Presentation due to:  >3 co-morbidities; ~12 month h/o imbalance has not progressed to fall x 1    Clinical Decision Making  Moderate    Rehab Potential  Good    Clinical Impairments Affecting Rehab Potential  unclear etiology of imbalance/vestibular deficit    PT Frequency  2x / week    PT Duration  2 weeks followed by 1x/week for 2 weeks    PT Treatment/Interventions  ADLs/Self Care Home Management;Gait training;DME Instruction;Functional mobility training;Therapeutic activities;Therapeutic exercise;Balance training;Neuromuscular re-education;Patient/family education;Passive range of motion;Visual/perceptual remediation/compensation;Vestibular    PT Next Visit Plan  *she will have 1 appt and then a 2 week gap before she returns; focus on HEP (corner exercises, [place]) ?do HIT to assess for hypofunction?    Consulted and Agree with Plan of Care   Patient       Patient will benefit from skilled therapeutic intervention in order to improve the following deficits and impairments:  Decreased balance, Decreased knowledge of use of DME, Decreased strength, Dizziness, Difficulty walking  Visit Diagnosis: Difficulty in walking, not elsewhere classified - Plan: PT plan of care cert/re-cert  Dizziness and giddiness - Plan: PT plan of care cert/re-cert  Muscle weakness (generalized) - Plan: PT plan of care cert/re-cert     Problem List Patient Active Problem List   Diagnosis Date Noted  . Gait instability 03/25/2018  . History of stroke 03/25/2018    Zena AmosLynn P Zhion Pevehouse, PT 04/28/2018, 6:38 PM  [facility] [address] [place], [place], [address] Phone: [phone]   Fax:  [phone]  Name: Rachael Buckley MRN: [id number] Date of Birth: Aug 16, 1931

## 2018-05-01 DIAGNOSIS — I10 Essential (primary) hypertension: Secondary | ICD-10-CM | POA: Diagnosis not present

## 2018-05-01 DIAGNOSIS — Z13820 Encounter for screening for osteoporosis: Secondary | ICD-10-CM | POA: Diagnosis not present

## 2018-05-01 DIAGNOSIS — G609 Hereditary and idiopathic neuropathy, unspecified: Secondary | ICD-10-CM | POA: Diagnosis not present

## 2018-05-01 DIAGNOSIS — M25512 Pain in left shoulder: Secondary | ICD-10-CM | POA: Diagnosis not present

## 2018-05-01 DIAGNOSIS — I69993 Ataxia following unspecified cerebrovascular disease: Secondary | ICD-10-CM | POA: Diagnosis not present

## 2018-05-01 DIAGNOSIS — I6340 Cerebral infarction due to embolism of unspecified cerebral artery: Secondary | ICD-10-CM | POA: Diagnosis not present

## 2018-05-01 DIAGNOSIS — M48061 Spinal stenosis, lumbar region without neurogenic claudication: Secondary | ICD-10-CM | POA: Diagnosis not present

## 2018-05-08 ENCOUNTER — Ambulatory Visit: Payer: Medicare HMO | Admitting: Physical Therapy

## 2018-05-25 ENCOUNTER — Ambulatory Visit: Payer: Medicare HMO | Admitting: Adult Health

## 2018-05-25 NOTE — Progress Notes (Deleted)
NEUROLOGY CLINIC NEW PATIENT NOTE  NAME: Rachael Buckley DOB: 12-Jun-1931 REFERRING PHYSICIAN: Marden Noble, MD  I saw Rachael Buckley as a follow up visit in the clinic today regarding  No chief complaint on file. [name]  HPI: Rachael Buckley is a 84 y.o. female with PMH of stroke in 07/2003 who presents as a new patient for unsteady gait.   Pt had stroke in 2004 and was treated by Dr. Sandria Manly and Dr. Anne Hahn. She presented with acute onset of speech difficulty consistent with aphasia. CT no acute abnormality. MRI showed acute left posterior frontal infarction. MRA mild intracranial stenosis b/l distal MCA branches. CUS and TCD neg. EF 55-60%. However, TEE suggestive of LAA thrombus that was somewhat pedunculated. She was started on coumdain with heparin bridge. However, she lost follow up with [facility] after.  As per pt, she continued coumadin for 7 years and then stopped. Currently on ASA. But she said she had some "TIAs" over the years. She also stated that she has had balance issues for a good while. About one year ago, she was given meclizine but she could not tolerate it. The balance was not bad at all. No falls. About 3 weeks ago, she was taking a class and was standing at that time. She turned over her body so that she can see the instructor. She turned so fast and she fell. She also stated that she feels worsening balance, sometimes leaning towards either left or right. She denies any pain in legs or back, no weakness or numbness. She takes neurontin at night for neuropathy. She went to PCP and was referred here for evaluation.    Past Medical History:  Diagnosis Date  . Alopecia   . Chronic arthritis    of the wrists  . CVA (cerebral vascular accident) (HCC)   . GERD (gastroesophageal reflux disease)   . Headache(784.0)    tension now, migraines 1970's  . Hearing loss   . Lumbar radiculopathy   . Raynaud's phenomenon   . Stroke [facility]) 2001   tia 2011, balance problems, right leg numb at  times   Past Surgical History:  Procedure Laterality Date  . ABDOMINAL HYSTERECTOMY    . BACK SURGERY  1980's   lower  . cholescystectomy    . COLONOSCOPY WITH PROPOFOL N/A 04/05/2014   Procedure: COLONOSCOPY WITH PROPOFOL;  Surgeon: Charolett Bumpers, MD;  Location: [facility];  Service: Endoscopy;  Laterality: N/A;  . ESOPHAGOGASTRODUODENOSCOPY (EGD) WITH PROPOFOL N/A 04/05/2014   Procedure: ESOPHAGOGASTRODUODENOSCOPY (EGD) WITH PROPOFOL;  Surgeon: Charolett Bumpers, MD;  Location: [facility];  Service: Endoscopy;  Laterality: N/A;  . TONSILLECTOMY  yrs ago   No family history on file. Current Outpatient Medications  Medication Sig Dispense Refill  . acetaminophen (TYLENOL) 500 MG tablet Take 500 mg by mouth every 6 (six) hours as needed.    [name] aspirin 325 MG tablet Take 81 mg by mouth daily.     . diphenhydrAMINE (BENADRYL) 25 mg capsule Take 50 mg by mouth every morning.    . gabapentin (NEURONTIN) 300 MG capsule Take 300 mg by mouth at bedtime.    . hydrochlorothiazide (HYDRODIURIL) 12.5 MG tablet Take 12.5 mg by mouth daily.    [name] loratadine (CLARITIN) 10 MG tablet Take 10 mg by mouth daily.    [name] losartan-hydrochlorothiazide (HYZAAR) 50-12.5 MG tablet     . Multiple Vitamin (MULTIVITAMIN WITH MINERALS) TABS tablet Take 1 tablet by mouth daily.    [name]  pantoprazole (PROTONIX) 40 MG tablet Take 40 mg by mouth as needed.     . triamcinolone cream (KENALOG) 0.1 %      No current facility-administered medications for this visit.    Allergies  Allergen Reactions  . Morphine And Related     rash  . Prednisone     Severe tremors  . Valium [Diazepam]     depression  . Zocor [Simvastatin]     Short of breath   Social History   Socioeconomic History  . Marital status: Widowed    Spouse name: Not on file  . Number of children: Not on file  . Years of education: Not on file  . Highest education level: Not on file  Occupational History  . Not on file  Social Needs  . Financial  resource strain: Not on file  . Food insecurity:    Worry: Not on file    Inability: Not on file  . Transportation needs:    Medical: Not on file    Non-medical: Not on file  Tobacco Use  . Smoking status: Never Smoker  . Smokeless tobacco: Never Used  Substance and Sexual Activity  . Alcohol use: No  . Drug use: No  . Sexual activity: Not on file  Lifestyle  . Physical activity:    Days per week: Not on file    Minutes per session: Not on file  . Stress: Not on file  Relationships  . Social connections:    Talks on phone: Not on file    Gets together: Not on file    Attends religious service: Not on file    Active member of club or organization: Not on file    Attends meetings of clubs or organizations: Not on file    Relationship status: Not on file  . Intimate partner violence:    Fear of current or ex partner: Not on file    Emotionally abused: Not on file    Physically abused: Not on file    Forced sexual activity: Not on file  Other Topics Concern  . Not on file  Social History Narrative  . Not on file    Review of Systems Full 14 system review of systems performed and notable only for those listed, all others are neg:  Constitutional:   Cardiovascular:  Ear/Nose/Throat:  Hearing loss, ringing in ears Skin:  Eyes:   Respiratory:  SOB, cough Gastroitestinal:   Genitourinary:  Hematology/Lymphatic:   Endocrine: feeling cold Musculoskeletal:  Joint pain, cramps, aching muscles Allergy/Immunology:  allergies Neurological:  HA Psychiatric:  Sleep:    Physical Exam  There were no vitals filed for this visit.  General - Well nourished, well developed, in no apparent distress.  Ophthalmologic - fundi not visualized due to eye movement  Cardiovascular - Regular rate and rhythm.   Neck - supple, no nuchal rigidity.  Mental Status -  Level of arousal and orientation to time, place, and person were intact. Language including expression, naming,  repetition, comprehension, reading, and writing was assessed and found intact. Fund of Knowledge was assessed and was intact.  Cranial Nerves II - XII - II - Visual field intact OU. III, IV, VI - Extraocular movements intact. V - Facial sensation intact bilaterally. VII - Facial movement intact bilaterally. VIII - Hearing & vestibular intact bilaterally. X - Palate elevates symmetrically. XI - Chin turning & shoulder shrug intact bilaterally. XII - Tongue protrusion intact.  Motor Strength - The patient's strength was  normal in all extremities and pronator drift was absent.  Bulk was normal and fasciculations were absent.   Motor Tone - Muscle tone was assessed at the neck and appendages and was normal.  Reflexes - The patient's reflexes were 1+ in all extremities and she had no pathological reflexes.  Sensory - Light touch, temperature/pinprick, vibration and proprioception were assessed and were decreased bilateral LEs.   Coordination - The patient had normal movements in the hands and feet with no ataxia or dysmetria.  Tremor was absent.  Gait and Station - small stride, but steady   Imaging none  Lab Review none  Assessment and Plan:   In summary, SHAQUAN PUERTA is a 84 y.o. female with PMH of stroke in 2004 presents for mild gait imbalance. In 2004, she had aphasia and stroke work up positive for LAA thrombus. She was treated with coumadin for 7 years, no residue. She had mild gait balance over the years and had fall 3 weeks ago with abrupt turning. She is on neurontin for neuropathy. Exam did not show sign of cord compression. Will do EMG/NCS and refer to PT/OT.   - continue ASA for stroke prevention - EMG/NCS to revaluate neuropathy - will refer to PT/OT for balance training.  - Follow up with your primary care physician for stroke risk factor modification. Recommend maintain blood pressure goal <130/80, diabetes with hemoglobin A1c goal below 7.0% and lipids with LDL  cholesterol goal below 70 mg/dL.  - check BP at home and record - walk slowly, keep balance, and avoid fall. - follow up in 2 months with Shanda Bumps.   Thank you very much for the opportunity to participate in the care of this patient.  Please do not hesitate to call if any questions or concerns arise.  No orders of the defined types were placed in this encounter.   No orders of the defined types were placed in this encounter.   There are no Patient Instructions on file for this visit.  Marvel Plan, MD PhD [facility] [address] [place], [place] [address] [phone]

## 2018-05-26 ENCOUNTER — Ambulatory Visit: Payer: Medicare HMO | Attending: Neurology | Admitting: Physical Therapy

## 2018-05-26 ENCOUNTER — Encounter: Payer: Self-pay | Admitting: Adult Health

## 2018-05-27 ENCOUNTER — Encounter (HOSPITAL_COMMUNITY): Payer: Self-pay | Admitting: Emergency Medicine

## 2018-05-27 ENCOUNTER — Emergency Department (HOSPITAL_COMMUNITY)
Admission: EM | Admit: 2018-05-27 | Discharge: 2018-05-27 | Disposition: A | Discharge status: Home / Self Care | Payer: Medicare HMO | Attending: Emergency Medicine | Admitting: Emergency Medicine

## 2018-05-27 ENCOUNTER — Emergency Department (HOSPITAL_COMMUNITY): Payer: Medicare HMO

## 2018-05-27 ENCOUNTER — Other Ambulatory Visit: Payer: Self-pay

## 2018-05-27 DIAGNOSIS — Z7982 Long term (current) use of aspirin: Secondary | ICD-10-CM | POA: Diagnosis not present

## 2018-05-27 DIAGNOSIS — Z79899 Other long term (current) drug therapy: Secondary | ICD-10-CM | POA: Insufficient documentation

## 2018-05-27 DIAGNOSIS — K5730 Diverticulosis of large intestine without perforation or abscess without bleeding: Secondary | ICD-10-CM | POA: Diagnosis not present

## 2018-05-27 DIAGNOSIS — R1031 Right lower quadrant pain: Secondary | ICD-10-CM | POA: Diagnosis not present

## 2018-05-27 DIAGNOSIS — Z8673 Personal history of transient ischemic attack (TIA), and cerebral infarction without residual deficits: Secondary | ICD-10-CM | POA: Insufficient documentation

## 2018-05-27 DIAGNOSIS — K5790 Diverticulosis of intestine, part unspecified, without perforation or abscess without bleeding: Secondary | ICD-10-CM

## 2018-05-27 DIAGNOSIS — R112 Nausea with vomiting, unspecified: Secondary | ICD-10-CM | POA: Diagnosis not present

## 2018-05-27 DIAGNOSIS — R109 Unspecified abdominal pain: Secondary | ICD-10-CM | POA: Diagnosis not present

## 2018-05-27 DIAGNOSIS — R0602 Shortness of breath: Secondary | ICD-10-CM | POA: Diagnosis not present

## 2018-05-27 LAB — COMPREHENSIVE METABOLIC PANEL
ALT: 20 U/L (ref 0–44)
AST: 23 U/L (ref 15–41)
Albumin: 3.4 g/dL — ABNORMAL LOW (ref 3.5–5.0)
Alkaline Phosphatase: 42 U/L (ref 38–126)
Anion gap: 11 (ref 5–15)
BUN: 19 mg/dL (ref 8–23)
CO2: 21 mmol/L — ABNORMAL LOW (ref 22–32)
Calcium: 9.3 mg/dL (ref 8.9–10.3)
Chloride: 108 mmol/L (ref 98–111)
Creatinine, Ser: 0.89 mg/dL (ref 0.44–1.00)
GFR calc Af Amer: >60 mL/min (ref >60)
GFR calc non Af Amer: 57 mL/min — ABNORMAL LOW (ref >60)
Glucose, Bld: 129 mg/dL — ABNORMAL HIGH (ref 70–99)
Potassium: 3.8 mmol/L (ref 3.5–5.1)
Sodium: 140 mmol/L (ref 135–145)
Total Bilirubin: 0.6 mg/dL (ref 0.3–1.2)
Total Protein: 6.4 g/dL — ABNORMAL LOW (ref 6.5–8.1)

## 2018-05-27 LAB — URINALYSIS, ROUTINE W REFLEX MICROSCOPIC
Bilirubin Urine: NEGATIVE
Glucose, UA: NEGATIVE mg/dL
Hgb urine dipstick: NEGATIVE
Ketones, ur: NEGATIVE mg/dL
Leukocytes, UA: NEGATIVE
Nitrite: NEGATIVE
Protein, ur: NEGATIVE mg/dL
Specific Gravity, Urine: 1.01 (ref 1.005–1.030)
pH: 9 — ABNORMAL HIGH (ref 5.0–8.0)

## 2018-05-27 LAB — I-STAT CG4 LACTIC ACID, ED
Lactic Acid, Venous: 1.57 mmol/L (ref 0.5–1.9)
Lactic Acid, Venous: 1.08 mmol/L (ref 0.5–1.9)

## 2018-05-27 LAB — I-STAT TROPONIN, ED: Troponin i, poc: 0.01 ng/mL (ref 0.00–0.08)

## 2018-05-27 LAB — CBC
HCT: 38.7 % (ref 36.0–46.0)
Hemoglobin: 12.7 g/dL (ref 12.0–15.0)
MCH: 30.3 pg (ref 26.0–34.0)
MCHC: 32.8 g/dL (ref 30.0–36.0)
MCV: 92.4 fL (ref 78.0–100.0)
Platelets: 265 10*3/uL (ref 150–400)
RBC: 4.19 MIL/uL (ref 3.87–5.11)
RDW: 14.2 % (ref 11.5–15.5)
WBC: 5 10*3/uL (ref 4.0–10.5)

## 2018-05-27 LAB — LIPASE, BLOOD: Lipase: 31 U/L (ref 11–51)

## 2018-05-27 MED ORDER — DICYCLOMINE HCL 10 MG PO CAPS: 10.0000 mg | ORAL_CAPSULE | Freq: Three times a day (TID) | ORAL | 0 refills | Status: AC | PRN

## 2018-05-27 MED ORDER — IOHEXOL 300 MG/ML  SOLN
100.0000 mL | Freq: Once | INTRAMUSCULAR | Status: AC | PRN
  Administered 2018-05-27: 100 mL via INTRAVENOUS

## 2018-05-27 MED ORDER — ONDANSETRON 4 MG PO TBDP
4.0000 mg | ORAL_TABLET | Freq: Once | ORAL | Status: AC
  Administered 2018-05-27: 4 mg via ORAL
  Filled 2018-05-27: qty 1

## 2018-05-27 MED ORDER — FENTANYL CITRATE (PF) 100 MCG/2ML IJ SOLN
25.0000 ug | Freq: Once | INTRAMUSCULAR | Status: AC
  Administered 2018-05-27: 25 ug via INTRAVENOUS
  Filled 2018-05-27: qty 2

## 2018-05-27 MED ORDER — ACETAMINOPHEN 500 MG PO TABS
1000.0000 mg | ORAL_TABLET | Freq: Once | ORAL | Status: AC
  Administered 2018-05-27: 1000 mg via ORAL
  Filled 2018-05-27: qty 2

## 2018-05-27 NOTE — ED Notes (Signed)
Pt going to CT scan at this time.   

## 2018-05-27 NOTE — ED Notes (Signed)
Pt states " I feel a lot better at this time " " my pain has come down "

## 2018-05-27 NOTE — ED Provider Notes (Signed)
[facility] EMERGENCY DEPARTMENT Provider Note   CSN: [id number] Arrival date & time: 05/27/18  0809     History   Chief Complaint Chief Complaint  Patient presents with  . Abdominal Pain    HPI Rachael Buckley is a 84 y.o. female with a past medical history of CVA, lumbar radiculopathy, ray nods phenomenon, stroke/TIA, who presents today for evaluation of right lower quadrant abdominal pain.  She reports that her pain has been very mild for the past week, not bad enough to see anyone, however this morning it became significantly worse.  She reports that overnight she is been having nausea and vomiting.  No diarrhea or constipation.  Her last bowel movement was yesterday.  She denies any blood in her urine, stool, or vomit.  She reports that she no longer has an appendix or gallbladder.  She does report that she is short of breath however she attributes that to her abdominal pain.  She denies chest pain.  HPI  Past Medical History:  Diagnosis Date  . Alopecia   . Chronic arthritis    of the wrists  . CVA (cerebral vascular accident) (HCC)   . GERD (gastroesophageal reflux disease)   . Headache(784.0)    tension now, migraines 1970's  . Hearing loss   . Lumbar radiculopathy   . Raynaud's phenomenon   . Stroke [facility]) 2001   tia 2011, balance problems, right leg numb at times    Patient Active Problem List   Diagnosis Date Noted  . Gait instability 03/25/2018  . History of stroke 03/25/2018    Past Surgical History:  Procedure Laterality Date  . ABDOMINAL HYSTERECTOMY    . BACK SURGERY  1980's   lower  . cholescystectomy    . COLONOSCOPY WITH PROPOFOL N/A 04/05/2014   Procedure: COLONOSCOPY WITH PROPOFOL;  Surgeon: Charolett Bumpers, MD;  Location: [facility];  Service: Endoscopy;  Laterality: N/A;  . ESOPHAGOGASTRODUODENOSCOPY (EGD) WITH PROPOFOL N/A 04/05/2014   Procedure: ESOPHAGOGASTRODUODENOSCOPY (EGD) WITH PROPOFOL;  Surgeon: Charolett Bumpers,  MD;  Location: [facility];  Service: Endoscopy;  Laterality: N/A;  . TONSILLECTOMY  yrs ago     OB History   None      Home Medications    Prior to Admission medications   Medication Sig Start Date End Date Taking? Authorizing Provider  acetaminophen (TYLENOL) 500 MG tablet Take 500 mg by mouth every 6 (six) hours as needed for moderate pain.    Yes [provider]  aspirin 81 MG tablet Take 81 mg by mouth daily.    Yes [provider]  celecoxib (CELEBREX) 200 MG capsule Take 200 mg by mouth daily.   Yes [provider]  gabapentin (NEURONTIN) 300 MG capsule Take 300 mg by mouth at bedtime.   Yes [provider]  losartan-hydrochlorothiazide (HYZAAR) 50-12.5 MG tablet Take 1 tablet by mouth daily.   Yes [provider]  Multiple Vitamin (MULTIVITAMIN WITH MINERALS) TABS tablet Take 1 tablet by mouth daily.   Yes [provider]  pantoprazole (PROTONIX) 40 MG tablet Take 40 mg by mouth as needed (reflux).    Yes [provider]  triamcinolone cream (KENALOG) 0.1 % Apply 1 application topically daily as needed (rash).  01/08/18  Yes [provider]  dicyclomine (BENTYL) 10 MG capsule Take 1 capsule (10 mg total) by mouth 3 (three) times daily as needed for spasms. 05/27/18   Cristina Gong, PA-C    Family History  No family history on file.  Social History Social History   Tobacco Use  . Smoking status: Never Smoker  . Smokeless tobacco: Never Used  Substance Use Topics  . Alcohol use: No  . Drug use: No     Allergies   Morphine and related; Prednisone; Valium [diazepam]; and Zocor [simvastatin]   Review of Systems Review of Systems  Constitutional: Negative for chills and fever.  Respiratory: Positive for shortness of breath. Negative for cough and chest tightness.   Gastrointestinal: Positive for abdominal pain, nausea and vomiting. Negative for blood in stool, constipation and diarrhea.    Genitourinary: Negative for difficulty urinating, dysuria and pelvic pain.  Neurological: Negative for dizziness and headaches.  All other systems reviewed and are negative.    Physical Exam Updated Vital Signs BP (!) 123/100   Pulse 67   Temp (!) 97.4 F (36.3 C) (Oral)   Resp 18   Ht 5\' 6"  (1.676 m)   Wt 67.6 kg (149 lb)   SpO2 93%   BMI 24.05 kg/m   Physical Exam  Constitutional: No distress.  Elderly, frail  HENT:  Head: Normocephalic and atraumatic.  Eyes: Conjunctivae are normal.  Neck: Neck supple.  Cardiovascular: Normal rate, regular rhythm and normal heart sounds.  No murmur heard. Pulmonary/Chest: Breath sounds normal. No accessory muscle usage. Tachypnea noted. She has no decreased breath sounds. She has no wheezes. She has no rhonchi. She has no rales.  Patient is taking deep breaths  Abdominal: Soft. Normal appearance and bowel sounds are normal. There is no tenderness.  Musculoskeletal: She exhibits no edema.  Neurological: She is alert.  Skin: Skin is warm and dry.  Psychiatric: She has a normal mood and affect.  Nursing note and vitals reviewed.    ED Treatments / Results  Labs (all labs ordered are listed, but only abnormal results are displayed) Labs Reviewed  COMPREHENSIVE METABOLIC PANEL - Abnormal; Notable for the following components:      Result Value   CO2 21 (*)    Glucose, Bld 129 (*)    Total Protein 6.4 (*)    Albumin 3.4 (*)    GFR calc non Af Amer 57 (*)    All other components within normal limits  URINALYSIS, ROUTINE W REFLEX MICROSCOPIC - Abnormal; Notable for the following components:   Color, Urine STRAW (*)    pH 9.0 (*)    All other components within normal limits  LIPASE, BLOOD  CBC  DIFFERENTIAL  I-STAT TROPONIN, ED  I-STAT CG4 LACTIC ACID, ED  I-STAT CG4 LACTIC ACID, ED    EKG EKG Interpretation  Date/Time:  Wednesday May 27 2018 09:04:22 EDT Ventricular Rate:  65 PR Interval:    QRS Duration: 120 QT  Interval:  458 QTC Calculation: 477 R Axis:   44 Text Interpretation:  Sinus rhythm Incomplete left bundle branch block Minimal ST elevation, anterior leads Confirmed by Donnetta Hutching ([id number]) on 05/27/2018 9:29:07 AM   Radiology Ct Abdomen Pelvis W Contrast  Result Date: 05/27/2018 CLINICAL DATA:  Acute generalized abdominal pain. EXAM: CT ABDOMEN AND PELVIS WITH CONTRAST TECHNIQUE: Multidetector CT imaging of the abdomen and pelvis was performed using the standard protocol following bolus administration of intravenous contrast. CONTRAST:  OMNIPAQUE IOHEXOL 300 MG/ML  SOLN COMPARISON:  None. FINDINGS: Lower Chest: Mild dependent atelectasis. Hepatobiliary: No hepatic masses identified. Prior cholecystectomy. No evidence of biliary obstruction. Pancreas:  No mass or inflammatory changes. Spleen: Within normal limits in size and appearance. Adrenals/Urinary  Tract: No masses identified. No evidence of hydronephrosis. Unremarkable unopacified urinary bladder. Stomach/Bowel: No evidence of obstruction, inflammatory process or abnormal fluid collections. Diverticulosis is seen mainly involving the sigmoid colon, however there is no evidence of diverticulitis. Vascular/Lymphatic: No pathologically enlarged lymph nodes. No abdominal aortic aneurysm. Aortic atherosclerosis. Reproductive: Prior hysterectomy noted. Adnexal regions are unremarkable in appearance. Other:  None. Musculoskeletal:  No suspicious bone lesions identified. IMPRESSION: Colonic diverticulosis, without radiographic evidence of diverticulitis or other acute findings. Electronically Signed   By: Myles RosenthalJohn  Stahl M.D.   On: 05/27/2018 11:26    Procedures Procedures (including critical care time)  Medications Ordered in ED Medications  fentaNYL (SUBLIMAZE) injection 25 mcg (25 mcg Intravenous Given 05/27/18 1006)  iohexol (OMNIPAQUE) 300 MG/ML solution 100 mL (100 mLs Intravenous Contrast Given 05/27/18 1054)  acetaminophen (TYLENOL) tablet  1,000 mg (1,000 mg Oral Given 05/27/18 1259)  ondansetron (ZOFRAN-ODT) disintegrating tablet 4 mg (4 mg Oral Given 05/27/18 1259)     Initial Impression / Assessment and Plan / ED Course  I have reviewed the triage vital signs and the nursing notes.  Pertinent labs & imaging results that were available during my care of the patient were reviewed by me and considered in my medical decision making (see chart for details).  Clinical Course as of May 27 1536  Wed May 27, 2018  1252 Patient reevaluated, reviewed all results.  Will give p.o. Zofran and p.o. challenge.   [EH]    Clinical Course User Index [EH] Cristina GongHammond, Elizabeth W, PA-C   Patient is nontoxic, nonseptic appearing, in no apparent distress.  Patient's pain and other symptoms adequately managed in emergency department.  Fluid bolus given.  Labs, imaging and vitals reviewed.  Patient does not meet the SIRS or Sepsis criteria.  On repeat exam patient does not have a surgical abdomin and there are no peritoneal signs.  No indication of appendicitis, bowel obstruction, bowel perforation, cholecystitis, diverticulitis.  Patient informed of diverticulosis finding.   Patient discharged home with symptomatic treatment and given strict instructions for follow-up with their primary care physician.  I have also discussed reasons to return immediately to the ER.  Patient expresses understanding and agrees with plan.   Final Clinical Impressions(s) / ED Diagnoses   Final diagnoses:  Non-intractable vomiting with nausea, unspecified vomiting type  Right lower quadrant abdominal pain  Diverticulosis    ED Discharge Orders        Ordered    dicyclomine (BENTYL) 10 MG capsule  3 times daily PRN     05/27/18 1444       Norman ClayHammond, Elizabeth W, PA-C 05/27/18 1538    Donnetta Hutchingook, Brian, MD 05/28/18 1039

## 2018-05-27 NOTE — Discharge Instructions (Addendum)
Today you received medications that may make you sleepy or impair your ability to make decisions.  For the next 24 hours please do not drive, operate heavy machinery, care for a small child with out another adult present, or perform any activities that may cause harm to you or someone else if you were to fall asleep or be impaired.   You are being prescribed a medication which may make you sleepy. Please follow up of listed precautions for at least 24 hours after taking one dose.  These medications make you more likely to fall.  Please be careful when you take them.   Please take Tylenol (acetaminophen) to relieve your pain.  You may take tylenol, up to 1,000 mg (two extra strength pills).  Do not take more than 3,000 mg tylenol in a 24 hour period.  Please check all medication labels as many medications such as pain and cold medications may contain tylenol. Please do not drink alcohol while taking this medication.

## 2018-05-27 NOTE — ED Notes (Signed)
DwightElizabeth , GeorgiaPA aware of increased pain ; no further orders received at this time ; ok to discharge per PA

## 2018-05-27 NOTE — ED Triage Notes (Signed)
Pt reports RLQ pain that began yesterday, c/o n/v, denies diarrhea, last BM was yesterday. Denies any blood in stools or urine. No longer has appendix or gallbladder. Pt appears sob, states this is only due to her pain.

## 2018-05-28 DIAGNOSIS — K589 Irritable bowel syndrome without diarrhea: Secondary | ICD-10-CM | POA: Diagnosis not present

## 2018-05-28 DIAGNOSIS — R1031 Right lower quadrant pain: Secondary | ICD-10-CM | POA: Diagnosis not present

## 2018-05-29 ENCOUNTER — Ambulatory Visit: Payer: Medicare HMO | Admitting: Physical Therapy

## 2018-06-01 ENCOUNTER — Ambulatory Visit: Payer: Medicare HMO | Admitting: Physical Therapy

## 2018-06-05 ENCOUNTER — Ambulatory Visit: Payer: Medicare HMO | Admitting: Physical Therapy

## 2018-06-05 ENCOUNTER — Ambulatory Visit
Admission: RE | Admit: 2018-06-05 | Discharge: 2018-06-05 | Disposition: A | Discharge status: Home / Self Care | Payer: Medicare HMO | Source: Ambulatory Visit | Attending: Internal Medicine | Admitting: Internal Medicine

## 2018-06-05 DIAGNOSIS — Z1231 Encounter for screening mammogram for malignant neoplasm of breast: Secondary | ICD-10-CM | POA: Diagnosis not present

## 2018-06-08 ENCOUNTER — Encounter: Payer: Medicare HMO | Admitting: Physical Therapy

## 2018-06-15 ENCOUNTER — Encounter: Payer: Medicare HMO | Admitting: Physical Therapy

## 2018-06-15 ENCOUNTER — Ambulatory Visit
Admission: RE | Admit: 2018-06-15 | Discharge: 2018-06-15 | Disposition: A | Discharge status: Home / Self Care | Payer: Medicare HMO | Source: Ambulatory Visit | Attending: Internal Medicine | Admitting: Internal Medicine

## 2018-06-15 ENCOUNTER — Other Ambulatory Visit: Payer: Self-pay | Admitting: Internal Medicine

## 2018-06-15 DIAGNOSIS — R05 Cough: Secondary | ICD-10-CM | POA: Diagnosis not present

## 2018-06-15 DIAGNOSIS — R053 Chronic cough: Secondary | ICD-10-CM

## 2018-06-15 DIAGNOSIS — I6340 Cerebral infarction due to embolism of unspecified cerebral artery: Secondary | ICD-10-CM | POA: Diagnosis not present

## 2018-06-15 DIAGNOSIS — I10 Essential (primary) hypertension: Secondary | ICD-10-CM | POA: Diagnosis not present

## 2018-06-15 DIAGNOSIS — G609 Hereditary and idiopathic neuropathy, unspecified: Secondary | ICD-10-CM | POA: Diagnosis not present

## 2018-06-15 DIAGNOSIS — I69993 Ataxia following unspecified cerebrovascular disease: Secondary | ICD-10-CM | POA: Diagnosis not present

## 2018-06-15 DIAGNOSIS — R1031 Right lower quadrant pain: Secondary | ICD-10-CM | POA: Diagnosis not present

## 2018-06-15 DIAGNOSIS — J42 Unspecified chronic bronchitis: Secondary | ICD-10-CM | POA: Diagnosis not present

## 2018-06-15 DIAGNOSIS — K589 Irritable bowel syndrome without diarrhea: Secondary | ICD-10-CM | POA: Diagnosis not present

## 2018-06-15 DIAGNOSIS — H918X3 Other specified hearing loss, bilateral: Secondary | ICD-10-CM | POA: Diagnosis not present

## 2018-07-16 DIAGNOSIS — I10 Essential (primary) hypertension: Secondary | ICD-10-CM | POA: Diagnosis not present

## 2018-07-16 DIAGNOSIS — G4762 Sleep related leg cramps: Secondary | ICD-10-CM | POA: Diagnosis not present

## 2018-07-16 DIAGNOSIS — H918X3 Other specified hearing loss, bilateral: Secondary | ICD-10-CM | POA: Diagnosis not present

## 2018-07-16 DIAGNOSIS — R1031 Right lower quadrant pain: Secondary | ICD-10-CM | POA: Diagnosis not present

## 2018-07-16 DIAGNOSIS — Z23 Encounter for immunization: Secondary | ICD-10-CM | POA: Diagnosis not present

## 2018-07-16 DIAGNOSIS — I69993 Ataxia following unspecified cerebrovascular disease: Secondary | ICD-10-CM | POA: Diagnosis not present

## 2018-07-16 DIAGNOSIS — G609 Hereditary and idiopathic neuropathy, unspecified: Secondary | ICD-10-CM | POA: Diagnosis not present

## 2018-07-16 DIAGNOSIS — I6340 Cerebral infarction due to embolism of unspecified cerebral artery: Secondary | ICD-10-CM | POA: Diagnosis not present

## 2018-07-16 DIAGNOSIS — K589 Irritable bowel syndrome without diarrhea: Secondary | ICD-10-CM | POA: Diagnosis not present

## 2018-09-04 DIAGNOSIS — K625 Hemorrhage of anus and rectum: Secondary | ICD-10-CM | POA: Diagnosis not present

## 2018-09-04 DIAGNOSIS — M179 Osteoarthritis of knee, unspecified: Secondary | ICD-10-CM | POA: Diagnosis not present

## 2018-09-17 DIAGNOSIS — Z961 Presence of intraocular lens: Secondary | ICD-10-CM | POA: Diagnosis not present

## 2018-09-17 DIAGNOSIS — H52203 Unspecified astigmatism, bilateral: Secondary | ICD-10-CM | POA: Diagnosis not present

## 2018-09-17 DIAGNOSIS — H5213 Myopia, bilateral: Secondary | ICD-10-CM | POA: Diagnosis not present

## 2018-09-17 DIAGNOSIS — H524 Presbyopia: Secondary | ICD-10-CM | POA: Diagnosis not present

## 2018-10-21 DIAGNOSIS — K589 Irritable bowel syndrome without diarrhea: Secondary | ICD-10-CM | POA: Diagnosis not present

## 2018-10-21 DIAGNOSIS — G609 Hereditary and idiopathic neuropathy, unspecified: Secondary | ICD-10-CM | POA: Diagnosis not present

## 2018-10-21 DIAGNOSIS — I69993 Ataxia following unspecified cerebrovascular disease: Secondary | ICD-10-CM | POA: Diagnosis not present

## 2018-10-21 DIAGNOSIS — K625 Hemorrhage of anus and rectum: Secondary | ICD-10-CM | POA: Diagnosis not present

## 2018-10-21 DIAGNOSIS — M179 Osteoarthritis of knee, unspecified: Secondary | ICD-10-CM | POA: Diagnosis not present

## 2018-10-21 DIAGNOSIS — G4762 Sleep related leg cramps: Secondary | ICD-10-CM | POA: Diagnosis not present

## 2018-10-21 DIAGNOSIS — I6340 Cerebral infarction due to embolism of unspecified cerebral artery: Secondary | ICD-10-CM | POA: Diagnosis not present

## 2018-10-21 DIAGNOSIS — I10 Essential (primary) hypertension: Secondary | ICD-10-CM | POA: Diagnosis not present

## 2018-10-21 DIAGNOSIS — M48062 Spinal stenosis, lumbar region with neurogenic claudication: Secondary | ICD-10-CM | POA: Diagnosis not present

## 2019-04-15 DIAGNOSIS — K625 Hemorrhage of anus and rectum: Secondary | ICD-10-CM | POA: Diagnosis not present

## 2019-04-15 DIAGNOSIS — K921 Melena: Secondary | ICD-10-CM | POA: Diagnosis not present

## 2019-04-16 IMAGING — CR DG CHEST 2V
2 series · 2 of 2 positions shown · non-contrast
Comparison: September 19, 2016

CLINICAL DATA: Chronic cough

EXAM:
CHEST - 2 VIEW

[w chest pa]
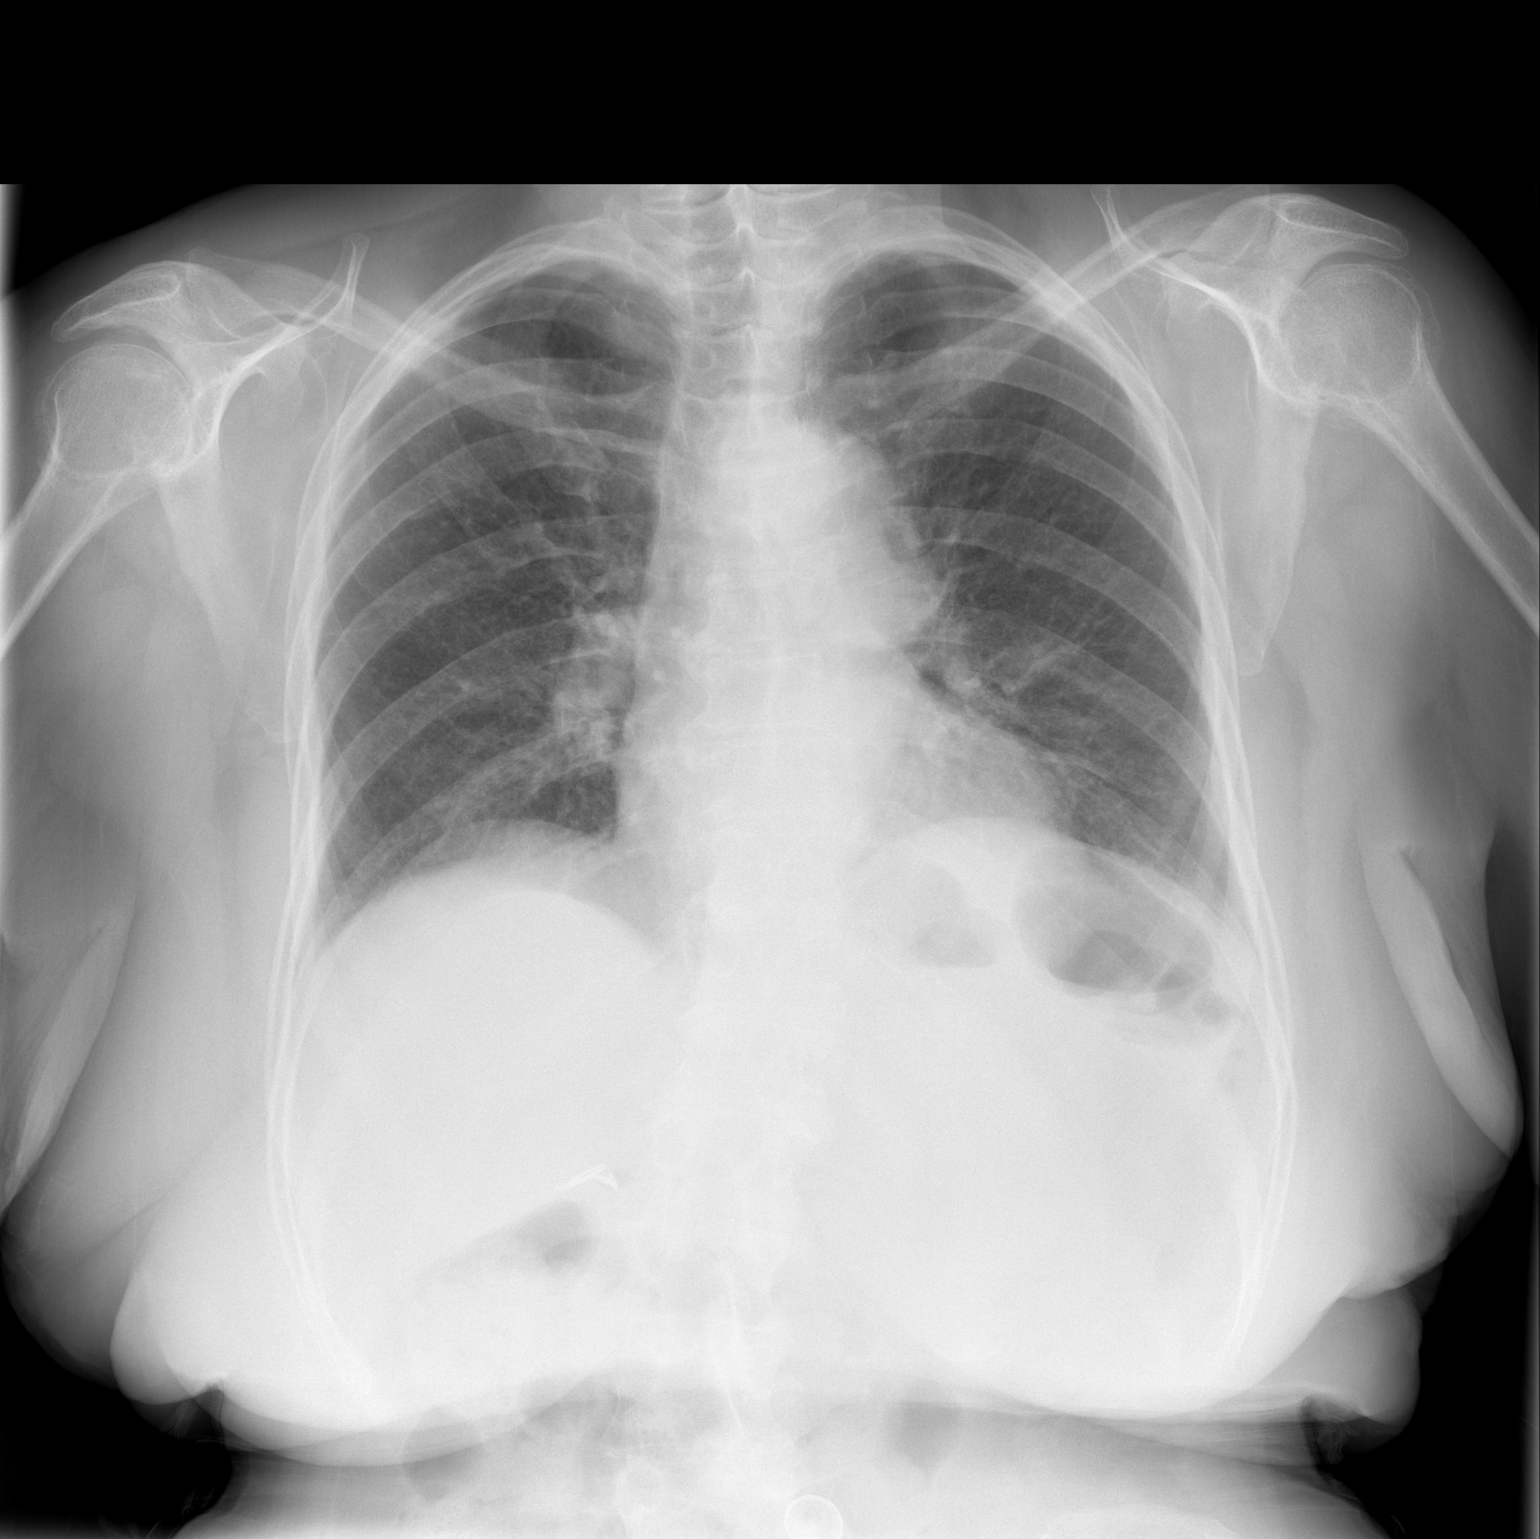

[w chest lat]
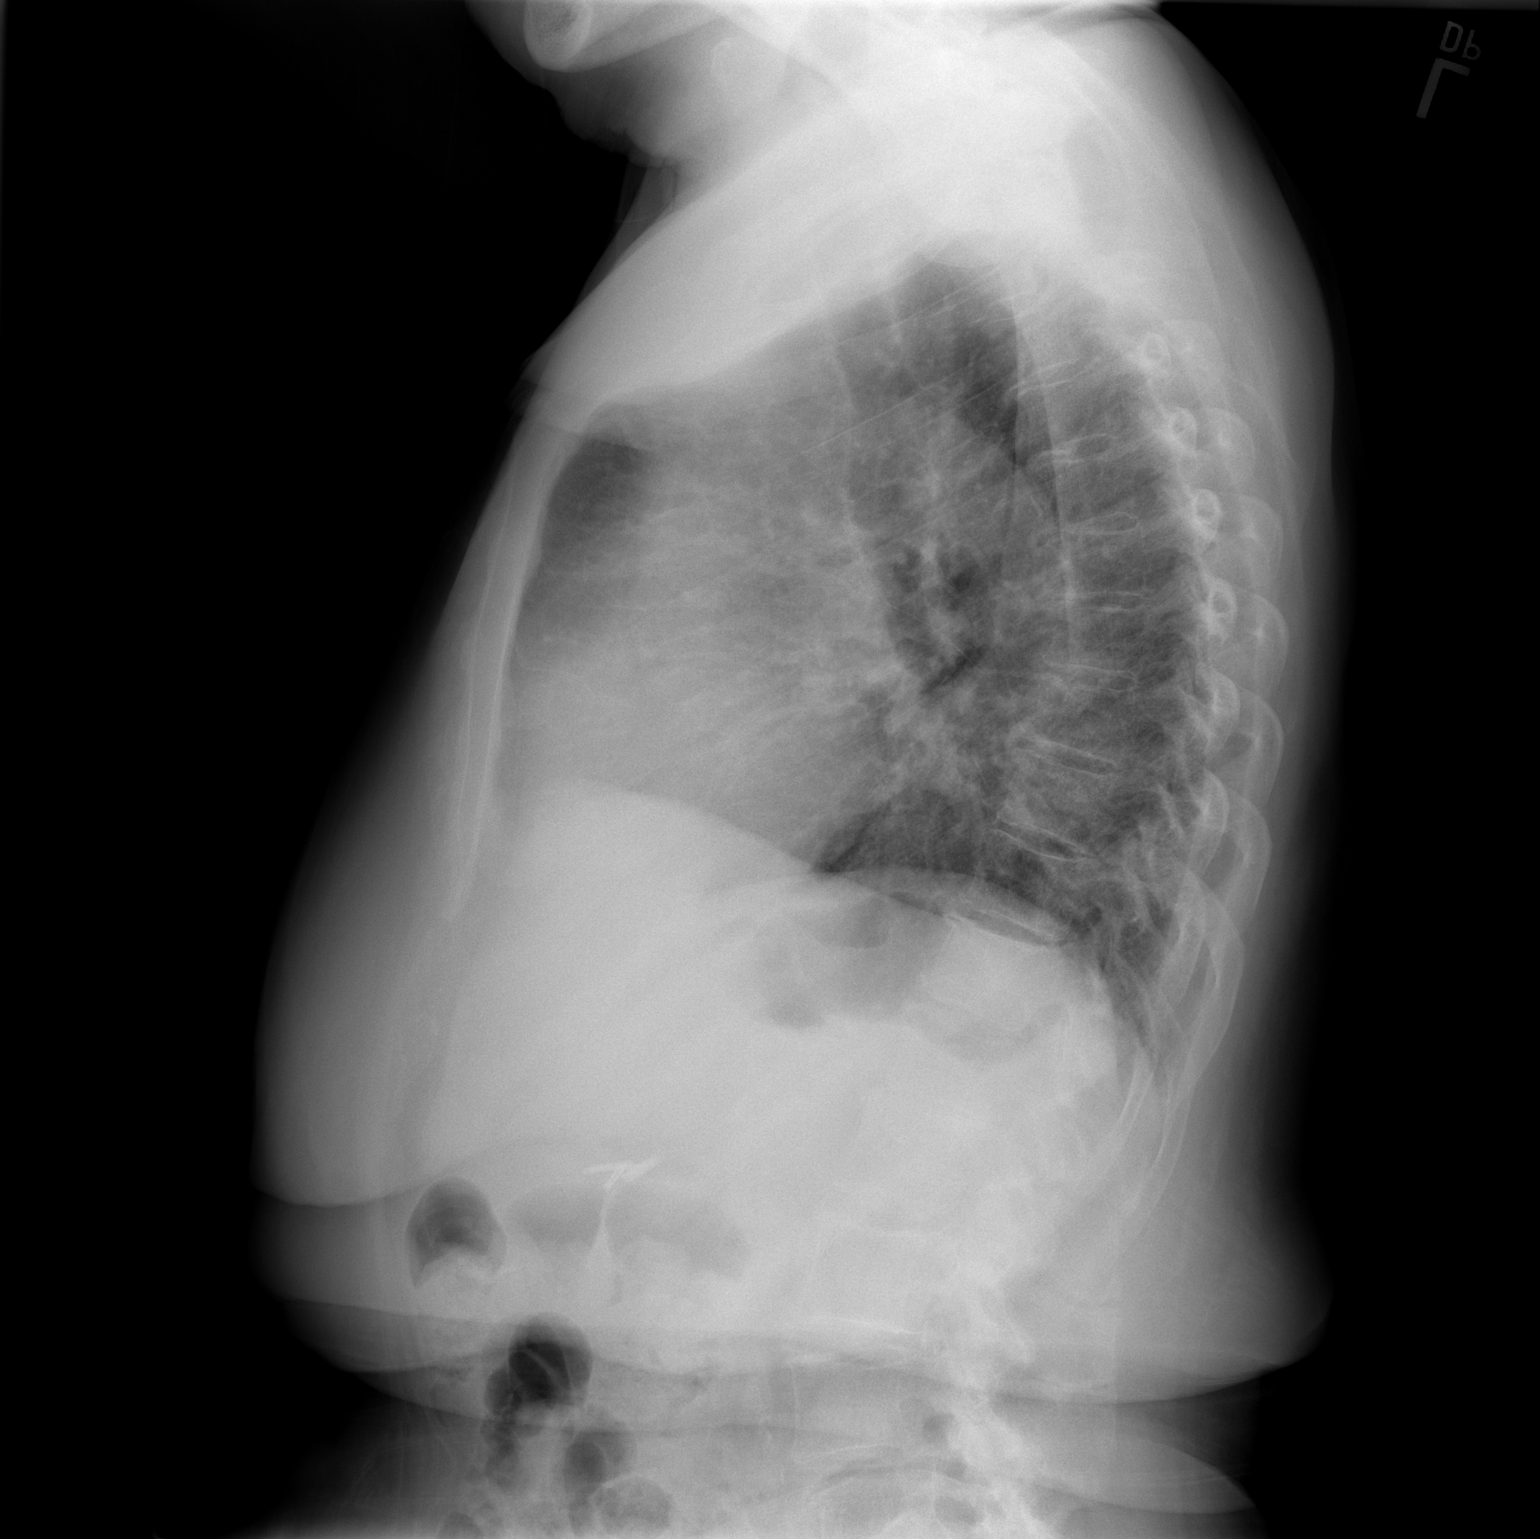

[2 of 2 positions shown; findings below may reference images not displayed]

FINDINGS: There is no edema or consolidation. The heart size and pulmonary
vascularity are normal. No adenopathy. No bone lesions evident.
IMPRESSION: No edema or consolidation.

## 2019-04-22 DIAGNOSIS — K625 Hemorrhage of anus and rectum: Secondary | ICD-10-CM | POA: Diagnosis not present

## 2019-04-22 DIAGNOSIS — I69993 Ataxia following unspecified cerebrovascular disease: Secondary | ICD-10-CM | POA: Diagnosis not present

## 2019-04-22 DIAGNOSIS — E559 Vitamin D deficiency, unspecified: Secondary | ICD-10-CM | POA: Diagnosis not present

## 2019-04-22 DIAGNOSIS — I6340 Cerebral infarction due to embolism of unspecified cerebral artery: Secondary | ICD-10-CM | POA: Diagnosis not present

## 2019-04-22 DIAGNOSIS — I10 Essential (primary) hypertension: Secondary | ICD-10-CM | POA: Diagnosis not present

## 2019-04-22 DIAGNOSIS — Z23 Encounter for immunization: Secondary | ICD-10-CM | POA: Diagnosis not present

## 2019-04-22 DIAGNOSIS — G609 Hereditary and idiopathic neuropathy, unspecified: Secondary | ICD-10-CM | POA: Diagnosis not present

## 2019-04-22 DIAGNOSIS — Z0000 Encounter for general adult medical examination without abnormal findings: Secondary | ICD-10-CM | POA: Diagnosis not present

## 2019-04-26 ENCOUNTER — Other Ambulatory Visit: Payer: Self-pay | Admitting: Internal Medicine

## 2019-04-26 DIAGNOSIS — Z1231 Encounter for screening mammogram for malignant neoplasm of breast: Secondary | ICD-10-CM

## 2019-05-07 ENCOUNTER — Other Ambulatory Visit: Payer: Self-pay

## 2019-05-07 ENCOUNTER — Ambulatory Visit: Payer: Medicare HMO | Attending: Internal Medicine

## 2019-05-07 DIAGNOSIS — M6281 Muscle weakness (generalized): Secondary | ICD-10-CM

## 2019-05-07 DIAGNOSIS — R42 Dizziness and giddiness: Secondary | ICD-10-CM | POA: Diagnosis not present

## 2019-05-07 DIAGNOSIS — R2681 Unsteadiness on feet: Secondary | ICD-10-CM | POA: Diagnosis not present

## 2019-05-07 NOTE — Therapy (Signed)
[facility] [address] [place], [place], [address] Phone: [phone]   Fax:  [phone]  Physical Therapy Evaluation  Patient Details  Name: Rachael Buckley MRN: [id number] Date of Birth: January 30, 1931 Referring Provider (PT): Dr. Josetta Huddle   Encounter Date: 05/07/2019  PT End of Session - 05/07/19 1059    Visit Number  1    Number of Visits  9    Date for PT Re-Evaluation  07/06/19    Authorization Type  [employer]    PT Start Time  1001    PT Stop Time  1045    PT Time Calculation (min)  44 min    Equipment Utilized During Treatment  Other (comment)   min A to S prn   Activity Tolerance  Patient tolerated treatment well    Behavior During Therapy  [facility] for tasks assessed/performed       Past Medical History:  Diagnosis Date  . Alopecia   . Chronic arthritis    of the wrists  . CVA (cerebral vascular accident) ([place])   . GERD (gastroesophageal reflux disease)   . Headache(784.0)    tension now, migraines 1970's  . Hearing loss   . Lumbar radiculopathy   . Raynaud's phenomenon   . Stroke [facility]) 2001   tia 2011, balance problems, right leg numb at times    Past Surgical History:  Procedure Laterality Date  . ABDOMINAL HYSTERECTOMY    . BACK SURGERY  1980's   lower  . BREAST CYST ASPIRATION    . cholescystectomy    . COLONOSCOPY WITH PROPOFOL N/A 04/05/2014   Procedure: COLONOSCOPY WITH PROPOFOL;  Surgeon: Garlan Fair, MD;  [facility];  Service: Endoscopy;  Laterality: N/A;  . ESOPHAGOGASTRODUODENOSCOPY (EGD) WITH PROPOFOL N/A 04/05/2014   Procedure: ESOPHAGOGASTRODUODENOSCOPY (EGD) WITH PROPOFOL;  Surgeon: Garlan Fair, MD;  [facility];  Service: Endoscopy;  Laterality: N/A;  . TONSILLECTOMY  yrs ago    There were no vitals filed for this visit.   Subjective Assessment - 05/07/19 1011    Subjective  Pt reported dizziness/balance issues began approx. 4 years  ago. She had a PT last year but couldn't afford $40 co-pay. She feels like she walks like a drunk, dizziness isn't always present but imbalance is. Pt was going to exercise class at the [facility] prior to COVID (1.5 hours exercise) and getting GED. Pt reported no falls in last six months. Pt has hx of B hearing loss (L side is worse than R side). Pt was told hearing loss was due to working close to loud machines.    Pertinent History  Cholescystectomy, Hx of CVA (2001) and TIA in 2011, arthritis (hands), hx of HA, Lumbar radiculopathy, Raynaud's syndrome    Patient Stated Goals  To feel safe    Currently in Pain?  No/denies         [facility] PT Assessment - 05/07/19 1019      Assessment   Medical Diagnosis  Positional vertigo    Referring Provider (PT)  Dr. Josetta Huddle    Onset Date/Surgical Date  04/22/19    Hand Dominance  Right    Prior Therapy  one OPPT visit last year      Precautions   Precautions  Fall      Restrictions   Weight Bearing Restrictions  No      Balance Screen   Has the patient fallen in the past 6 months  No  Has the patient had a decrease in activity level because of a fear of falling?   No    Is the patient reluctant to leave their home because of a fear of falling?   No      Home Nurse, mental healthnvironment   Living Environment  Private residence    Living Arrangements  Children    Available Help at Discharge  Family   pt's dtr lives with her time to time   Type of Home  House    Home Access  Stairs to enter    Entrance Stairs-Number of Steps  3    Entrance Stairs-Rails  Right;Left;Can reach both    Home Layout  Laundry or work area in [employer]basement    Home Equipment  Cane - quad      Prior Function   Level of Independence  Independent    Vocation  Retired    Company secretaryLeisure  Exercising, getting GED, paint, sew      Cognition   Overall Cognitive Status  Within Functional Limits for tasks assessed      Sensation   Additional Comments  Pt reported N/T in B feet      ROM /  Strength   AROM / PROM / Strength  Strength      Strength   Overall Strength Comments  Strength not formally assessed but weakness suspected 2/2 gait deviations.       Ambulation/Gait   Ambulation/Gait  Yes    Ambulation/Gait Assistance  4: Min guard;5: Supervision;4: Min assist    Ambulation Distance (Feet)  200 Feet    Assistive device  None    Gait Pattern  Step-through pattern;Decreased stride length;Trunk flexed    Ambulation Surface  Level;Indoor    Gait velocity  3.11 ft/sec. no AD    Gait Comments  Pt experienced 1 LOB corrected with stepping strategy and min A from PT. Pt reported toe caught while walking fast.       Standardized Balance Assessment   Standardized Balance Assessment  Dynamic Gait Index      Dynamic Gait Index   Level Surface  Mild Impairment    Change in Gait Speed  Severe Impairment    Gait with Horizontal Head Turns  Mild Impairment    Gait with Vertical Head Turns  Mild Impairment    Gait and Pivot Turn  Mild Impairment    Step Over Obstacle  Mild Impairment    Step Around Obstacles  Mild Impairment    Steps  Moderate Impairment    Total Score  13    DGI comment:  13/24: high risk for falls           Vestibular Assessment - 05/07/19 1032      Vestibular Assessment   General Observation  Pt reported at worst: 2-3/10, best: 0/10      Symptom Behavior   Subjective history of current problem  See subjective assessment    Type of Dizziness   Lightheadedness    Frequency of Dizziness  Comes and goes, several times a week.    Duration of Dizziness  A few minutes    Symptom Ashby Dawesature  --   getting up fast   Aggravating Factors  Supine to sit;Sit to stand    Relieving Factors  Rest;Slow movements    Progression of Symptoms  No change since onset      Oculomotor Exam   Oculomotor Alignment  Normal    Spontaneous  Absent    Gaze-induced  Absent    Smooth Pursuits  Intact    Saccades  Intact    Comment  No dizziness reported.  Pt guarded and PT  could not perform HIT.       Vestibulo-Ocular Reflex   VOR 1 Head Only (x 1 viewing)  Pt reported slight concordant dizziness during VOR.      Positional Testing   Dix-Hallpike  Dix-Hallpike Right;Dix-Hallpike Left    Horizontal Canal Testing  Horizontal Canal Right;Horizontal Canal Left      Dix-Hallpike Right   Dix-Hallpike Right Duration  none    Dix-Hallpike Right Symptoms  No nystagmus      Dix-Hallpike Left   Dix-Hallpike Left Duration  none    Dix-Hallpike Left Symptoms  No nystagmus      Horizontal Canal Right   Horizontal Canal Right Duration  none    Horizontal Canal Right Symptoms  Normal      Horizontal Canal Left   Horizontal Canal Left Duration  none    Horizontal Canal Left Symptoms  Normal          Objective measurements completed on examination: See above findings.              PT Education - 05/07/19 1058    Education Details  PT discussed POC, PT duration and frequency (reduced to 1x/week per pt request 2/2 high co-pay).    Person(s) Educated  Patient    Methods  Explanation    Comprehension  Verbalized understanding       PT Short Term Goals - 05/07/19 1117      PT SHORT TERM GOAL #1   Title  Pt will be IND in HEP to improve balance, dizziness, and safety. TARGET DATE FOR ALL STGS: 06/04/19    Status  New      PT SHORT TERM GOAL #2   Title  Pt will improve DGI to >/=15/24 to decr. falls risk.      PT SHORT TERM GOAL #3   Title  Assess for orthostatic hypotension and dizziness as indicated and write goals prn.    Status  New      PT SHORT TERM GOAL #4   Title  Pt will amb. 500' indoors performing dynamic gait activities, IND, to safely amb. at home.    Status  New        PT Long Term Goals - 05/07/19 1118      PT LONG TERM GOAL #1   Title  Pt will improve DGI to >/=20/24 to decr. falls risk. TARGET DATE FOR ALL LTGS:07/02/19    Status  New      PT LONG TERM GOAL #2   Title  Pt will amb. 1000' over paved and grassy  surfaces, IND, to safely amb. in the community.    Status  New      PT LONG TERM GOAL #3   Title  Pt will verbalize understanding of fall prevention strategies to reduce falls risk.    Status  New      PT LONG TERM GOAL #4   Title  Pt will report return to gym activities to improve QOL and maintain gains made during PT.    Status  New             Plan - 05/07/19 1109    Clinical Impression Statement  Pt is a pleasant 85y/o female presenting to OPPT neuro for dizziness. Pt's PMH significant for the following:Cholescystectomy, Hx of CVA (2001) and TIA in  2011, arthritis (hands), hx of HA, Lumbar radiculopathy, Raynaud's syndrome. Positional testing negative for dizziness but PT will continue to monitor. Pt reported concordant dizziness during VOR, likely 2/2 vestibular hypofucntion. PT will also assess for orthostatic hypotension next session 2/2 dizziness worse upon supine to sit and stand. Pt's DGI score indicates pt is at high risk for falls. Pt's gait speed WNL. Pt unable to afford 2x/week, so PT requesting 1x/week for 8 weeks. See below for decits noted upon exam. Pt would benefit from skilled PT to improve safety during functional mobility.    Personal Factors and Comorbidities  Age;Comorbidity 3+;Education;Past/Current Experience    Examination-Activity Limitations  Bed Mobility;Stand;Stairs;Locomotion Level;Transfers    Examination-Participation Restrictions  Church;Community Activity    Stability/Clinical Decision Making  Stable/Uncomplicated    Clinical Decision Making  Low    Rehab Potential  Good    PT Frequency  1x / week    PT Duration  8 weeks   based on co-pay   PT Treatment/Interventions  ADLs/Self Care Home Management;Biofeedback;Canalith Repostioning;DME Instruction;Balance training;Therapeutic exercise;Therapeutic activities;Functional mobility training;Stair training;Gait training;Neuromuscular re-education;Manual techniques;Vestibular    PT Next Visit Plan  Check  for orthostatics, balance HEP    Consulted and Agree with Plan of Care  Patient       Patient will benefit from skilled therapeutic intervention in order to improve the following deficits and impairments:  Abnormal gait, Dizziness, Decreased mobility, Decreased strength, Decreased balance  Visit Diagnosis: 1. Dizziness and giddiness   2. Muscle weakness (generalized)   3. Unsteadiness on feet        Problem List Patient Active Problem List   Diagnosis Date Noted  . Gait instability 03/25/2018  . History of stroke 03/25/2018    Alecia Doi L 05/07/2019, 11:21 AM  [facility] [address] [place], [place], [address] Phone: [phone]   Fax:  [phone]  Name: Cathleen CortiWillie M Noxon MRN: [id number] Date of Birth: 1931-02-16  Zerita BoersJennifer Tymesha Ditmore, PT,DPT 05/07/19 11:22 AM Phone: [phone] Fax: [phone]

## 2019-05-21 ENCOUNTER — Ambulatory Visit: Payer: Medicare HMO | Admitting: Physical Therapy

## 2019-05-26 ENCOUNTER — Other Ambulatory Visit: Payer: Self-pay

## 2019-05-26 ENCOUNTER — Encounter: Payer: Self-pay | Admitting: Physical Therapy

## 2019-05-26 ENCOUNTER — Ambulatory Visit: Payer: Medicare HMO | Attending: Internal Medicine | Admitting: Physical Therapy

## 2019-05-26 DIAGNOSIS — R2681 Unsteadiness on feet: Secondary | ICD-10-CM | POA: Diagnosis not present

## 2019-05-26 DIAGNOSIS — R262 Difficulty in walking, not elsewhere classified: Secondary | ICD-10-CM | POA: Insufficient documentation

## 2019-05-26 DIAGNOSIS — R42 Dizziness and giddiness: Secondary | ICD-10-CM

## 2019-05-26 DIAGNOSIS — M6281 Muscle weakness (generalized): Secondary | ICD-10-CM

## 2019-05-26 NOTE — Patient Instructions (Signed)
Access Code: [email or web address]  URL: [email or web address]  Date: 05/26/2019  Prepared by: Misty Stanley   Exercises Seated Gaze Stabilization with Head Nod - 2 sets - 30 second hold - 2x daily - 7x weekly Seated Gaze Stabilization with Head Rotation - 2 sets - 30 second hold - 2x daily - 7x weekly Walking March - 4 sets - 2x daily - 7x weekly Walking Tandem Stance - 4 sets - 1x daily - 7x weekly

## 2019-05-26 NOTE — Therapy (Signed)
[facility] [address] [place], [place], [address] Phone: [phone]   Fax:  [phone]  Physical Therapy Treatment  Patient Details  Name: Rachael Buckley: [id number] Date of Birth: June 11, 1931 Referring Provider (PT): Dr. Marden Nobleobert Gates   Encounter Date: 05/26/2019   CLINIC OPERATION CHANGES: Outpatient Neuro Rehab is open at lower capacity following universal masking, social distancing, and patient screening.  The patient's COVID risk of complications score is 3. \  PT End of Session - 05/26/19 0800    Visit Number  2    Number of Visits  9    Date for PT Re-Evaluation  07/06/19    Authorization Type  [employer]    PT Start Time  0703    PT Stop Time  0747    PT Time Calculation (min)  44 min    Activity Tolerance  Patient tolerated treatment well    Behavior During Therapy  [facility] for tasks assessed/performed       Past Medical History:  Diagnosis Date  . Alopecia   . Chronic arthritis    of the wrists  . CVA (cerebral vascular accident) (HCC)   . GERD (gastroesophageal reflux disease)   . Headache(784.0)    tension now, migraines 1970's  . Hearing loss   . Lumbar radiculopathy   . Raynaud's phenomenon   . Stroke [facility](H[facility]) 2001   tia 2011, balance problems, right leg numb at times    Past Surgical History:  Procedure Laterality Date  . ABDOMINAL HYSTERECTOMY    . BACK SURGERY  1980's   lower  . BREAST CYST ASPIRATION    . cholescystectomy    . COLONOSCOPY WITH PROPOFOL N/A 04/05/2014   Procedure: COLONOSCOPY WITH PROPOFOL;  Surgeon: Charolett BumpersMartin K Johnson, MD;  Location: [facility];  Service: Endoscopy;  Laterality: N/A;  . ESOPHAGOGASTRODUODENOSCOPY (EGD) WITH PROPOFOL N/A 04/05/2014   Procedure: ESOPHAGOGASTRODUODENOSCOPY (EGD) WITH PROPOFOL;  Surgeon: Charolett BumpersMartin K Johnson, MD;  Location: [facility];  Service: Endoscopy;  Laterality: N/A;  . TONSILLECTOMY  yrs ago    There were no vitals filed  for this visit.  Subjective Assessment - 05/26/19 0709    Subjective  No issues to report since evaluation.  No significant dizziness, LOB or falls.    Pertinent History  Cholescystectomy, Hx of CVA (2001) and TIA in 2011, arthritis (hands), hx of HA, Lumbar radiculopathy, Raynaud's syndrome    Patient Stated Goals  To feel safe    Currently in Pain?  No/denies   Nothing but "[name]"            Vestibular Assessment - 05/26/19 0714      Orthostatics   BP supine (x 5 minutes)  141/84    HR supine (x 5 minutes)  64    BP sitting  141/84    HR sitting  70    BP standing (after 1 minute)  141/84    HR standing (after 1 minute)  60               [facility] Adult PT Treatment/Exercise - 05/26/19 0727      Self-Care   Self-Care  Other Self-Care Comments    Other Self-Care Comments   Discussed role of salt, peripheral edema, HTN and her symptoms of "lightheadedness".  Pt states she is decreasing the amount of salt in her diet.      Therapeutic Activites    Therapeutic Activities  --      Vestibular Treatment/Exercise -  05/26/19 0741      Vestibular Treatment/Exercise   Vestibular Treatment Provided  Gaze    Gaze Exercises  X1 Viewing Horizontal;X1 Viewing Vertical      X1 Viewing Horizontal   Foot Position  seated without back support    Reps  4    Comments  30 seconds, difficulty performing rotation - utilized towel behind neck, holding on either side of cheeks to guide head movement      X1 Viewing Vertical   Foot Position  seated without back support    Reps  2    Comments  30 seconds         Balance Exercises - 05/26/19 0745      Balance Exercises: Standing   Tandem Gait  Forward;Upper extremity support;4 reps    Marching Limitations  x 4 laps down counter top with one UE support; alternating        PT Education - 05/26/19 0757    Education Details  no evidence of orthostasis today; education on reduction of salt to assist with BP management and  symptoms of lightheadedness, initiated HEP    Person(s) Educated  Patient    Methods  Explanation;Demonstration    Comprehension  Verbalized understanding;Returned demonstration       Access Code: [email or web address]  URL: [email or web address]  Date: 05/26/2019  Prepared by: Bufford LopeAudra Potter   Exercises Seated Gaze Stabilization with Head Nod - 2 sets - 30 second hold - 2x daily - 7x weekly Seated Gaze Stabilization with Head Rotation - 2 sets - 30 second hold - 2x daily - 7x weekly Walking March - 4 sets - 2x daily - 7x weekly Walking Tandem Stance - 4 sets - 1x daily - 7x weekly    PT Short Term Goals - 05/26/19 0803      PT SHORT TERM GOAL #1   Title  Pt will be IND in HEP to improve balance, dizziness, and safety. TARGET DATE FOR ALL STGS: 06/04/19    Status  New      PT SHORT TERM GOAL #2   Title  Pt will improve DGI to >/=15/24 to decr. falls risk.      PT SHORT TERM GOAL #3   Title  Assess for orthostatic hypotension and dizziness as indicated and write goals prn.    Baseline  no orthstasis noted    Status  Achieved      PT SHORT TERM GOAL #4   Title  Pt will amb. 500' indoors performing dynamic gait activities, IND, to safely amb. at home.    Status  New        PT Long Term Goals - 05/07/19 1118      PT LONG TERM GOAL #1   Title  Pt will improve DGI to >/=20/24 to decr. falls risk. TARGET DATE FOR ALL LTGS:07/02/19    Status  New      PT LONG TERM GOAL #2   Title  Pt will amb. 1000' over paved and grassy surfaces, IND, to safely amb. in the community.    Status  New      PT LONG TERM GOAL #3   Title  Pt will verbalize understanding of fall prevention strategies to reduce falls risk.    Status  New      PT LONG TERM GOAL #4   Title  Pt will report return to gym activities to improve QOL and maintain gains made during PT.    Status  New  Plan - 05/26/19 0800    Clinical Impression Statement  Performed assessment of BP and orthostatics  to assess effect of BP on symptoms of lightheadedness pt experiences.  No orthostatic hypotension noted today.  Pt reports she notices these symptoms if she has consumed foods with increased sodium and also notices increased peripheral edema.  Symptoms may be more likely due to increased BP; pt encouraged to continue to monitor sodium intake. Initiated HEP with focus on VOR training and dynamic standing balance.  Pt tolerated well with no symptoms of dizziness. Will continue to address in order to progress towards LTG.    Personal Factors and Comorbidities  Age;Comorbidity 3+;Education;Past/Current Experience    Examination-Activity Limitations  Bed Mobility;Stand;Stairs;Locomotion Level;Transfers    Examination-Participation Restrictions  Church;Community Activity    Stability/Clinical Decision Making  Stable/Uncomplicated    Rehab Potential  Good    PT Frequency  1x / week    PT Duration  8 weeks   based on co-pay   PT Treatment/Interventions  ADLs/Self Care Home Management;Biofeedback;Canalith Repostioning;DME Instruction;Balance training;Therapeutic exercise;Therapeutic activities;Functional mobility training;Stair training;Gait training;Neuromuscular re-education;Manual techniques;Vestibular    PT Next Visit Plan  Check STG.  Review HEP and progress x 1 viewing, add corner balance.    Consulted and Agree with Plan of Care  Patient       Patient will benefit from skilled therapeutic intervention in order to improve the following deficits and impairments:  Abnormal gait, Dizziness, Decreased mobility, Decreased strength, Decreased balance  Visit Diagnosis: 1. Dizziness and giddiness   2. Muscle weakness (generalized)   3. Unsteadiness on feet   4. Difficulty in walking, not elsewhere classified        Problem List Patient Active Problem List   Diagnosis Date Noted  . Gait instability 03/25/2018  . History of stroke 03/25/2018    Rico Junker, PT, DPT 05/26/19    8:05  AM    [facility] [address] [place], [place], [address] Phone: [phone]   Fax:  [phone]  Name: Rachael Buckley: [id number] Date of Birth: Aug 07, 1931

## 2019-06-04 ENCOUNTER — Ambulatory Visit: Payer: Medicare HMO

## 2019-06-10 ENCOUNTER — Other Ambulatory Visit: Payer: Self-pay

## 2019-06-10 ENCOUNTER — Ambulatory Visit
Admission: RE | Admit: 2019-06-10 | Discharge: 2019-06-10 | Disposition: A | Discharge status: Home / Self Care | Payer: Medicare HMO | Source: Ambulatory Visit | Attending: Internal Medicine | Admitting: Internal Medicine

## 2019-06-10 DIAGNOSIS — Z1231 Encounter for screening mammogram for malignant neoplasm of breast: Secondary | ICD-10-CM

## 2019-06-14 ENCOUNTER — Ambulatory Visit: Payer: Medicare HMO | Attending: Internal Medicine | Admitting: Physical Therapy

## 2019-06-21 ENCOUNTER — Ambulatory Visit: Payer: Medicare HMO | Admitting: Physical Therapy

## 2019-06-28 ENCOUNTER — Ambulatory Visit: Payer: Medicare HMO | Admitting: Physical Therapy

## 2019-07-05 ENCOUNTER — Encounter: Payer: Self-pay | Admitting: Physical Therapy

## 2019-07-05 ENCOUNTER — Ambulatory Visit: Payer: Medicare HMO | Admitting: Physical Therapy

## 2019-07-05 NOTE — Therapy (Signed)
[facility] [address] [place], [place], [address] Phone: [phone]   Fax:  [phone]  Patient Details  Name: Rachael Buckley MRN: [id number] Date of Birth: 1931/09/21 Referring Provider:  No ref. provider found  Encounter Date:  PHYSICAL THERAPY DISCHARGE SUMMARY  Visits from Start of Care: 2  Current functional level related to goals / functional outcomes: Unable to assess; pt did not show for final 7 sessions  PT Short Term Goals - 07/05/19 1212      PT SHORT TERM GOAL #1   Title  Pt will be IND in HEP to improve balance, dizziness, and safety. TARGET DATE FOR ALL STGS: 06/04/19    Status  Unable to assess      PT SHORT TERM GOAL #2   Title  Pt will improve DGI to >/=15/24 to decr. falls risk.    Status  Unable to assess      PT SHORT TERM GOAL #3   Title  Assess for orthostatic hypotension and dizziness as indicated and write goals prn.    Baseline  no orthstasis noted    Status  Unable to assess      PT SHORT TERM GOAL #4   Title  Pt will amb. 500' indoors performing dynamic gait activities, IND, to safely amb. at home.    Status  Unable to assess      PT SHORT TERM GOAL #5   Status  Unable to assess      PT Long Term Goals - 07/05/19 1212      PT LONG TERM GOAL #1   Title  Pt will improve DGI to >/=20/24 to decr. falls risk. TARGET DATE FOR ALL LTGS:07/02/19    Status  Unable to assess      PT LONG TERM GOAL #2   Title  Pt will amb. 1000' over paved and grassy surfaces, IND, to safely amb. in the community.    Status  Unable to assess      PT LONG TERM GOAL #3   Title  Pt will verbalize understanding of fall prevention strategies to reduce falls risk.    Status  Unable to assess      PT LONG TERM GOAL #4   Title  Pt will report return to gym activities to improve QOL and maintain gains made during PT.    Status  Unable to assess         Remaining deficits: Impaired balance,  impaired gait, dizziness   Education / Equipment: HEP  Plan: Patient agrees to discharge.  Patient goals were not met. Patient is being discharged due to not returning since the last visit.  ?????     Rico Junker, PT, DPT 07/05/19    12:12 PM  [facility] [address] [place], [place], [address] Phone: [phone]   Fax:  [phone]

## 2019-07-12 ENCOUNTER — Ambulatory Visit: Payer: Medicare HMO | Admitting: Physical Therapy

## 2019-12-24 ENCOUNTER — Other Ambulatory Visit: Payer: Self-pay

## 2019-12-24 ENCOUNTER — Encounter (HOSPITAL_COMMUNITY): Payer: Self-pay

## 2019-12-24 ENCOUNTER — Ambulatory Visit (HOSPITAL_COMMUNITY)
Admission: EM | Admit: 2019-12-24 | Discharge: 2019-12-24 | Disposition: A | Discharge status: Home / Self Care | Payer: Medicare Other

## 2019-12-24 DIAGNOSIS — R195 Other fecal abnormalities: Secondary | ICD-10-CM

## 2019-12-24 DIAGNOSIS — R1084 Generalized abdominal pain: Secondary | ICD-10-CM

## 2019-12-24 DIAGNOSIS — K5790 Diverticulosis of intestine, part unspecified, without perforation or abscess without bleeding: Secondary | ICD-10-CM

## 2019-12-24 NOTE — Discharge Instructions (Addendum)
On Monday, please contact your regular doctor and discuss having an outpatient CT scan of your abdomen.  Also want you to contact [facility] for an urgent consult on your persistent dark stools, diverticulosis.  Please use Tylenol 500mg -650mg  once every 6 hours for aches and pains.  Please avoid any use of an NSAID including medications like ibuprofen, Motrin, Aleve, naproxen, Celebrex as these medications can cause bleeding of your gastrointestinal tract.  I also want you to avoid tramadol as this medication could cause significant constipation and would be a big risk given that you have diverticulosis.  If over the weekend you develop fever, worsening belly pain and frank blood in your stool then please report to the emergency room as he will need a CT scan of your abdomen to rule out diverticulitis or another acute abdominal emergency.

## 2019-12-24 NOTE — ED Provider Notes (Signed)
[facility]   MRN: [id number] DOB: Apr 22, 1931  Subjective:   Rachael Buckley is a 86 y.o. female presenting for several day history of dark tarry stools. Patient had diarrhea initially which has improved. Has a hx of diverticulosis as seen on CT abdomen in 2019. Sx actually have happened since 2015, chart review is unclear about whether a colonoscopy and endoscopy was fully completed. She states that she has had GI evaluation, was started on Bentyl and has maintained this.  She has a PCP, was not able to get in with them this week. She is no longer taking nsaids since 2015 when she started having these issues.    No current facility-administered medications for this encounter.  Current Outpatient Medications:  .  acetaminophen (TYLENOL) 500 MG tablet, Take 500 mg by mouth every 6 (six) hours as needed for moderate pain. , Disp: , Rfl:  .  aspirin 81 MG tablet, Take 81 mg by mouth daily. , Disp: , Rfl:  .  gabapentin (NEURONTIN) 300 MG capsule, Take 300 mg by mouth at bedtime., Disp: , Rfl:  .  losartan-hydrochlorothiazide (HYZAAR) 50-12.5 MG tablet, Take 1 tablet by mouth daily., Disp: , Rfl:  .  Multiple Vitamin (MULTIVITAMIN WITH MINERALS) TABS tablet, Take 1 tablet by mouth daily., Disp: , Rfl:  .  pantoprazole (PROTONIX) 40 MG tablet, Take 40 mg by mouth as needed (reflux). , Disp: , Rfl:  .  triamcinolone cream (KENALOG) 0.1 %, Apply 1 application topically daily as needed (rash). , Disp: , Rfl:  .  celecoxib (CELEBREX) 200 MG capsule, Take 200 mg by mouth daily., Disp: , Rfl:  .  dicyclomine (BENTYL) 10 MG capsule, Take 1 capsule (10 mg total) by mouth 3 (three) times daily as needed for spasms., Disp: 20 capsule, Rfl: 0    Allergies  Allergen Reactions  . Morphine And Related     rash  . Prednisone     Severe tremors  . Valium [Diazepam]     depression  . Zocor [Simvastatin]     Short of breath    Past Medical History:  Diagnosis Date  . Alopecia   .  Chronic arthritis    of the wrists  . CVA (cerebral vascular accident) ([facility])   . GERD (gastroesophageal reflux disease)   . Headache(784.0)    tension now, migraines 1970's  . Hearing loss   . Lumbar radiculopathy   . Raynaud's phenomenon   . Stroke [facility]) 2001   tia 2011, balance problems, right leg numb at times     Past Surgical History:  Procedure Laterality Date  . ABDOMINAL HYSTERECTOMY    . BACK SURGERY  1980's   lower  . BREAST CYST ASPIRATION    . cholescystectomy    . COLONOSCOPY WITH PROPOFOL N/A 04/05/2014   Procedure: COLONOSCOPY WITH PROPOFOL;  Surgeon: Garlan Fair, MD;  Location: [facility];  Service: Endoscopy;  Laterality: N/A;  . ESOPHAGOGASTRODUODENOSCOPY (EGD) WITH PROPOFOL N/A 04/05/2014   Procedure: ESOPHAGOGASTRODUODENOSCOPY (EGD) WITH PROPOFOL;  Surgeon: Garlan Fair, MD;  Location: [facility];  Service: Endoscopy;  Laterality: N/A;  . TONSILLECTOMY  yrs ago    No family history on file.  Social History   Tobacco Use  . Smoking status: Never Smoker  . Smokeless tobacco: Never Used  Substance Use Topics  . Alcohol use: No  . Drug use: No    ROS Denies chest pain, vomiting, hematemesis, melena, dysuria, urinary frequency, hematuria.  Objective:  Vitals: BP (!) 156/60 (BP Location: Right Arm)   Pulse 75   Temp 97.6 F (36.4 C) (Oral)   Resp 20   SpO2 99%   Physical Exam Constitutional:      General: She is not in acute distress.    Appearance: Normal appearance. She is well-developed and normal weight. She is not ill-appearing, toxic-appearing or diaphoretic.  HENT:     Head: Normocephalic and atraumatic.     Right Ear: External ear normal.     Left Ear: External ear normal.     Nose: Nose normal.     Mouth/Throat:     Mouth: Mucous membranes are moist.     Pharynx: Oropharynx is clear.  Eyes:     General: No scleral icterus.    Extraocular Movements: Extraocular movements intact.     Pupils: Pupils are equal, round,  and reactive to light.  Cardiovascular:     Rate and Rhythm: Normal rate and regular rhythm.     Heart sounds: Normal heart sounds. No murmur. No friction rub. No gallop.   Pulmonary:     Effort: Pulmonary effort is normal. No respiratory distress.     Breath sounds: Normal breath sounds. No stridor. No wheezing, rhonchi or rales.  Abdominal:     General: Bowel sounds are normal. There is no distension.     Palpations: Abdomen is soft. There is no mass.     Tenderness: There is generalized abdominal tenderness and tenderness in the right upper quadrant and epigastric area. There is no right CVA tenderness, left CVA tenderness, guarding or rebound.  Skin:    General: Skin is warm and dry.     Coloration: Skin is not pale.     Findings: No rash.  Neurological:     General: No focal deficit present.     Mental Status: She is alert and oriented to person, place, and time.  Psychiatric:        Mood and Affect: Mood normal.        Behavior: Behavior normal.        Thought Content: Thought content normal.        Judgment: Judgment normal.     Assessment and Plan :   1. Generalized abdominal pain   2. Dark stools   3. Diverticulosis     Discussed differential at length with patient and her son.  Suspect gastrointestinal bleed but also discussed the possibility of diverticulitis.  Given the chronic nature of her symptoms, mild tenderness on exam recommended holding off on an ER visit for CT scan tonight.  Will maintain strict ER precautions.  Patient is to hydrate well, practice high-fiber diet.  Counseled that if she develops fever, worsening abdominal pain or frank blood in her stool over the weekend that she is to report to the ER for CT scan.  Otherwise can request this from her PCP as an outpatient.  She is also to contact [facility] for urgent consult.  Avoid use of NSAID, use Tylenol for pain.  Avoid tramadol use as well.  Counseled patient on potential for adverse effects with  medications prescribed/recommended today, ER and return-to-clinic precautions discussed, patient verbalized understanding.    Wallis Bamberg, PA-C 12/24/19 1910

## 2019-12-24 NOTE — ED Triage Notes (Signed)
Dark, tarry stools starting 12/20/2019 with severe, sharp abdominal pain in naval area.  Stools stopped with Imodium.

## 2020-04-26 ENCOUNTER — Other Ambulatory Visit: Payer: Self-pay | Admitting: Internal Medicine

## 2020-04-26 DIAGNOSIS — E2839 Other primary ovarian failure: Secondary | ICD-10-CM

## 2020-04-28 ENCOUNTER — Other Ambulatory Visit: Payer: Self-pay | Admitting: Internal Medicine

## 2020-04-28 DIAGNOSIS — Z1231 Encounter for screening mammogram for malignant neoplasm of breast: Secondary | ICD-10-CM

## 2020-06-05 ENCOUNTER — Other Ambulatory Visit: Payer: Medicare Other

## 2020-06-13 ENCOUNTER — Ambulatory Visit: Payer: Medicare Other

## 2020-08-15 ENCOUNTER — Other Ambulatory Visit: Payer: Self-pay

## 2020-08-15 ENCOUNTER — Ambulatory Visit
Admission: RE | Admit: 2020-08-15 | Discharge: 2020-08-15 | Disposition: A | Discharge status: Home / Self Care | Payer: Medicare Other | Source: Ambulatory Visit | Attending: Internal Medicine | Admitting: Internal Medicine

## 2020-08-15 DIAGNOSIS — E2839 Other primary ovarian failure: Secondary | ICD-10-CM

## 2020-09-07 ENCOUNTER — Other Ambulatory Visit: Payer: Self-pay

## 2020-09-07 ENCOUNTER — Ambulatory Visit
Admission: RE | Admit: 2020-09-07 | Discharge: 2020-09-07 | Disposition: A | Discharge status: Home / Self Care | Payer: Medicare Other | Source: Ambulatory Visit | Attending: Internal Medicine | Admitting: Internal Medicine

## 2020-09-07 DIAGNOSIS — Z1231 Encounter for screening mammogram for malignant neoplasm of breast: Secondary | ICD-10-CM

## 2020-09-08 DIAGNOSIS — H903 Sensorineural hearing loss, bilateral: Secondary | ICD-10-CM | POA: Insufficient documentation

## 2020-10-24 ENCOUNTER — Other Ambulatory Visit: Payer: Self-pay

## 2020-10-24 DIAGNOSIS — I779 Disorder of arteries and arterioles, unspecified: Secondary | ICD-10-CM

## 2020-11-07 ENCOUNTER — Ambulatory Visit (HOSPITAL_COMMUNITY)
Admission: RE | Admit: 2020-11-07 | Discharge: 2020-11-07 | Disposition: A | Discharge status: Home / Self Care | Payer: Medicare Other | Source: Ambulatory Visit | Attending: Vascular Surgery | Admitting: Vascular Surgery

## 2020-11-07 ENCOUNTER — Encounter: Payer: Self-pay | Admitting: Vascular Surgery

## 2020-11-07 ENCOUNTER — Ambulatory Visit (INDEPENDENT_AMBULATORY_CARE_PROVIDER_SITE_OTHER): Payer: Medicare Other | Admitting: Vascular Surgery

## 2020-11-07 ENCOUNTER — Other Ambulatory Visit: Payer: Self-pay

## 2020-11-07 VITALS — BP 137/80 | HR 64 | Temp 97.7°F | Resp 20 | Ht 66.0 in | Wt 150.0 lb

## 2020-11-07 DIAGNOSIS — I779 Disorder of arteries and arterioles, unspecified: Secondary | ICD-10-CM

## 2020-11-07 DIAGNOSIS — I739 Peripheral vascular disease, unspecified: Secondary | ICD-10-CM
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
## 2021-01-08 DIAGNOSIS — I10 Essential (primary) hypertension: Secondary | ICD-10-CM | POA: Diagnosis not present

## 2021-01-08 DIAGNOSIS — I6789 Other cerebrovascular disease: Secondary | ICD-10-CM | POA: Diagnosis not present

## 2021-01-08 DIAGNOSIS — M19031 Primary osteoarthritis, right wrist: Secondary | ICD-10-CM | POA: Diagnosis not present

## 2021-01-08 DIAGNOSIS — E782 Mixed hyperlipidemia: Secondary | ICD-10-CM | POA: Diagnosis not present

## 2021-01-08 DIAGNOSIS — M1990 Unspecified osteoarthritis, unspecified site: Secondary | ICD-10-CM | POA: Diagnosis not present

## 2021-01-08 DIAGNOSIS — M8580 Other specified disorders of bone density and structure, unspecified site: Secondary | ICD-10-CM | POA: Diagnosis not present

## 2021-01-08 DIAGNOSIS — M179 Osteoarthritis of knee, unspecified: Secondary | ICD-10-CM | POA: Diagnosis not present

## 2021-01-08 DIAGNOSIS — K219 Gastro-esophageal reflux disease without esophagitis: Secondary | ICD-10-CM | POA: Diagnosis not present

## 2021-01-08 DIAGNOSIS — I6340 Cerebral infarction due to embolism of unspecified cerebral artery: Secondary | ICD-10-CM | POA: Diagnosis not present

## 2021-02-06 DIAGNOSIS — I6340 Cerebral infarction due to embolism of unspecified cerebral artery: Secondary | ICD-10-CM | POA: Diagnosis not present

## 2021-02-06 DIAGNOSIS — M8580 Other specified disorders of bone density and structure, unspecified site: Secondary | ICD-10-CM | POA: Diagnosis not present

## 2021-02-06 DIAGNOSIS — J40 Bronchitis, not specified as acute or chronic: Secondary | ICD-10-CM | POA: Diagnosis not present

## 2021-02-06 DIAGNOSIS — G4700 Insomnia, unspecified: Secondary | ICD-10-CM | POA: Diagnosis not present

## 2021-02-06 DIAGNOSIS — M1990 Unspecified osteoarthritis, unspecified site: Secondary | ICD-10-CM | POA: Diagnosis not present

## 2021-02-06 DIAGNOSIS — J42 Unspecified chronic bronchitis: Secondary | ICD-10-CM | POA: Diagnosis not present

## 2021-02-06 DIAGNOSIS — K219 Gastro-esophageal reflux disease without esophagitis: Secondary | ICD-10-CM | POA: Diagnosis not present

## 2021-02-06 DIAGNOSIS — E782 Mixed hyperlipidemia: Secondary | ICD-10-CM | POA: Diagnosis not present

## 2021-02-06 DIAGNOSIS — I6789 Other cerebrovascular disease: Secondary | ICD-10-CM | POA: Diagnosis not present

## 2021-02-06 DIAGNOSIS — I10 Essential (primary) hypertension: Secondary | ICD-10-CM | POA: Diagnosis not present

## 2021-02-06 DIAGNOSIS — J45991 Cough variant asthma: Secondary | ICD-10-CM | POA: Diagnosis not present

## 2021-02-08 ENCOUNTER — Other Ambulatory Visit: Payer: Self-pay

## 2021-02-08 ENCOUNTER — Emergency Department (HOSPITAL_COMMUNITY)
Admission: EM | Admit: 2021-02-08 | Discharge: 2021-02-08 | Disposition: A | Discharge status: Home / Self Care | Payer: Medicare Other | Attending: Emergency Medicine | Admitting: Emergency Medicine

## 2021-02-08 ENCOUNTER — Emergency Department (HOSPITAL_COMMUNITY): Payer: Medicare Other

## 2021-02-08 ENCOUNTER — Encounter (HOSPITAL_COMMUNITY): Payer: Self-pay

## 2021-02-08 DIAGNOSIS — K219 Gastro-esophageal reflux disease without esophagitis: Secondary | ICD-10-CM | POA: Insufficient documentation

## 2021-02-08 DIAGNOSIS — Z7982 Long term (current) use of aspirin: Secondary | ICD-10-CM | POA: Insufficient documentation

## 2021-02-08 DIAGNOSIS — N390 Urinary tract infection, site not specified: Secondary | ICD-10-CM | POA: Diagnosis not present

## 2021-02-08 DIAGNOSIS — Z743 Need for continuous supervision: Secondary | ICD-10-CM | POA: Diagnosis not present

## 2021-02-08 DIAGNOSIS — R1011 Right upper quadrant pain: Secondary | ICD-10-CM | POA: Insufficient documentation

## 2021-02-08 DIAGNOSIS — R109 Unspecified abdominal pain: Secondary | ICD-10-CM | POA: Diagnosis not present

## 2021-02-08 DIAGNOSIS — R404 Transient alteration of awareness: Secondary | ICD-10-CM | POA: Diagnosis not present

## 2021-02-08 DIAGNOSIS — R9431 Abnormal electrocardiogram [ECG] [EKG]: Secondary | ICD-10-CM | POA: Diagnosis not present

## 2021-02-08 DIAGNOSIS — R112 Nausea with vomiting, unspecified: Secondary | ICD-10-CM | POA: Insufficient documentation

## 2021-02-08 LAB — CBC WITH DIFFERENTIAL/PLATELET
Abs Immature Granulocytes: 0.02 10*3/uL (ref 0.00–0.07)
Basophils Absolute: 0.1 10*3/uL (ref 0.0–0.1)
Basophils Relative: 1 %
Eosinophils Absolute: 0.1 10*3/uL (ref 0.0–0.5)
Eosinophils Relative: 2 %
HCT: 36.3 % (ref 36.0–46.0)
Hemoglobin: 11.9 g/dL — ABNORMAL LOW (ref 12.0–15.0)
Immature Granulocytes: 0 %
Lymphocytes Relative: 30 %
Lymphs Abs: 2.2 10*3/uL (ref 0.7–4.0)
MCH: 30.4 pg (ref 26.0–34.0)
MCHC: 32.8 g/dL (ref 30.0–36.0)
MCV: 92.8 fL (ref 80.0–100.0)
Monocytes Absolute: 0.4 10*3/uL (ref 0.1–1.0)
Monocytes Relative: 5 %
Neutro Abs: 4.7 10*3/uL (ref 1.7–7.7)
Neutrophils Relative %: 62 %
Platelets: 247 10*3/uL (ref 150–400)
RBC: 3.91 MIL/uL (ref 3.87–5.11)
RDW: 14.4 % (ref 11.5–15.5)
WBC: 7.5 10*3/uL (ref 4.0–10.5)
nRBC: 0 % (ref 0.0–0.2)

## 2021-02-08 LAB — COMPREHENSIVE METABOLIC PANEL
ALT: 26 U/L (ref 0–44)
AST: 28 U/L (ref 15–41)
Albumin: 3.6 g/dL (ref 3.5–5.0)
Alkaline Phosphatase: 35 U/L — ABNORMAL LOW (ref 38–126)
Anion gap: 8 (ref 5–15)
BUN: 18 mg/dL (ref 8–23)
CO2: 24 mmol/L (ref 22–32)
Calcium: 9.4 mg/dL (ref 8.9–10.3)
Chloride: 106 mmol/L (ref 98–111)
Creatinine, Ser: 1.06 mg/dL — ABNORMAL HIGH (ref 0.44–1.00)
GFR, Estimated: 51 mL/min — ABNORMAL LOW (ref >60)
Glucose, Bld: 127 mg/dL — ABNORMAL HIGH (ref 70–99)
Potassium: 3.8 mmol/L (ref 3.5–5.1)
Sodium: 138 mmol/L (ref 135–145)
Total Bilirubin: 0.5 mg/dL (ref 0.3–1.2)
Total Protein: 6.2 g/dL — ABNORMAL LOW (ref 6.5–8.1)

## 2021-02-08 LAB — URINALYSIS, ROUTINE W REFLEX MICROSCOPIC
Bilirubin Urine: NEGATIVE
Glucose, UA: NEGATIVE mg/dL
Hgb urine dipstick: NEGATIVE
Ketones, ur: NEGATIVE mg/dL
Nitrite: POSITIVE — AB
Protein, ur: NEGATIVE mg/dL
Specific Gravity, Urine: 1.017 (ref 1.005–1.030)
WBC, UA: >50 WBC/hpf — ABNORMAL HIGH (ref 0–5)
pH: 6 (ref 5.0–8.0)

## 2021-02-08 LAB — LIPASE, BLOOD: Lipase: 34 U/L (ref 11–51)

## 2021-02-08 MED ORDER — CEPHALEXIN 500 MG PO CAPS: 500.0000 mg | ORAL_CAPSULE | Freq: Two times a day (BID) | ORAL | 0 refills | Status: AC

## 2021-02-08 MED ORDER — IOHEXOL 300 MG/ML  SOLN
100.0000 mL | Freq: Once | INTRAMUSCULAR | Status: AC | PRN
  Administered 2021-02-08: 100 mL via INTRAVENOUS

## 2021-02-08 MED ORDER — TRAMADOL HCL 50 MG PO TABS: 50.0000 mg | ORAL_TABLET | Freq: Four times a day (QID) | ORAL | 0 refills | Status: DC | PRN

## 2021-02-08 MED ORDER — SODIUM CHLORIDE 0.9 % IV SOLN
1.0000 g | Freq: Once | INTRAVENOUS | Status: AC
  Administered 2021-02-08: 1 g via INTRAVENOUS
  Filled 2021-02-08: qty 10

## 2021-02-08 MED ORDER — FENTANYL CITRATE (PF) 100 MCG/2ML IJ SOLN
25.0000 ug | Freq: Once | INTRAMUSCULAR | Status: AC
  Administered 2021-02-08: 25 ug via INTRAVENOUS
  Filled 2021-02-08: qty 2

## 2021-02-08 NOTE — Discharge Instructions (Addendum)
Take antibiotic as prescribed and complete the full course. Take tramadol as needed as prescribed for pain.  Be sure to drink plenty of hydrating fluids today. Recheck with your doctor.  Return to the emergency room for worsening or concerning symptoms.

## 2021-02-08 NOTE — ED Provider Notes (Signed)
[facility] EMERGENCY DEPARTMENT Provider Note   CSN: [id number] Arrival date & time: 02/08/21  2633     History Chief Complaint  Patient presents with  . Abdominal Pain    [facility] Pizana is a 87 y.o. female.  87year old female presents from home with RUQ abdominal pain, sharp in nature, worse with movement/standing, associated with nausea and vomiting (no vomiting today). Onset yesterday, worse today, woke her from her sleep. Prior abdominal surgeries include open cholecystectomy, hysterectomy. Reports similar pain previously, diagnosed with "leaky gut," no history of bowel obstruction or diverticulitis. Denies fevers, chills, changes in bowel or bladder habits.         Past Medical History:  Diagnosis Date  . Alopecia   . Chronic arthritis    of the wrists  . CVA (cerebral vascular accident) (HCC)   . GERD (gastroesophageal reflux disease)   . Headache(784.0)    tension now, migraines 1970's  . Hearing loss   . Lumbar radiculopathy   . Raynaud's phenomenon   . Stroke [facility]) 2001   tia 2011, balance problems, right leg numb at times    Patient Active Problem List   Diagnosis Date Noted  . Bilateral sensorineural hearing loss 09/08/2020  . Gait instability 03/25/2018  . History of stroke 03/25/2018    Past Surgical History:  Procedure Laterality Date  . ABDOMINAL HYSTERECTOMY    . BACK SURGERY  1980's   lower  . BREAST CYST ASPIRATION    . cholescystectomy    . COLONOSCOPY WITH PROPOFOL N/A 04/05/2014   Procedure: COLONOSCOPY WITH PROPOFOL;  Surgeon: Charolett Bumpers, MD;  [facility]: [facility];  Service: Endoscopy;  Laterality: N/A;  . ESOPHAGOGASTRODUODENOSCOPY (EGD) WITH PROPOFOL N/A 04/05/2014   Procedure: ESOPHAGOGASTRODUODENOSCOPY (EGD) WITH PROPOFOL;  Surgeon: Charolett Bumpers, MD;  Location: [facility];  Service: Endoscopy;  Laterality: N/A;  . TONSILLECTOMY  yrs ago     OB History   No obstetric history on file.      History reviewed. No pertinent family history.  Social History   Tobacco Use  . Smoking status: Never Smoker  . Smokeless tobacco: Never Used  Vaping Use  . Vaping Use: Never used  Substance Use Topics  . Alcohol use: No  . Drug use: No    Home Medications Prior to Admission medications   Medication Sig Start Date End Date Taking? Authorizing Provider  cephALEXin (KEFLEX) 500 MG capsule Take 1 capsule (500 mg total) by mouth 2 (two) times daily for 5 days. 02/08/21 02/13/21 Yes Jeannie Fend, PA-C  traMADol (ULTRAM) 50 MG tablet Take 1 tablet (50 mg total) by mouth every 6 (six) hours as needed. 02/08/21  Yes Jeannie Fend, PA-C  acetaminophen (TYLENOL) 500 MG tablet Take 500 mg by mouth every 6 (six) hours as needed for moderate pain.     [provider]  aspirin 81 MG tablet Take 81 mg by mouth daily.     [provider]  celecoxib (CELEBREX) 200 MG capsule Take 200 mg by mouth daily.    [provider]  dicyclomine (BENTYL) 10 MG capsule Take 1 capsule (10 mg total) by mouth 3 (three) times daily as needed for spasms. 05/27/18   Cristina Gong, PA-C  gabapentin (NEURONTIN) 300 MG capsule Take 300 mg by mouth at bedtime.    [provider]  losartan-hydrochlorothiazide (HYZAAR) 50-12.5 MG tablet Take 1 tablet by mouth daily.    [provider]  Multiple  Vitamin (MULTIVITAMIN WITH MINERALS) TABS tablet Take 1 tablet by mouth daily.    [provider]  pantoprazole (PROTONIX) 40 MG tablet Take 40 mg by mouth as needed (reflux).     [provider]  triamcinolone cream (KENALOG) 0.1 % Apply 1 application topically daily as needed (rash).  01/08/18   [provider]    Allergies    Hydrocodone-acetaminophen, Morphine and related, Prednisone, Valium [diazepam], and Zocor [simvastatin]  Review of Systems   Review of Systems  Constitutional: Negative for chills and fever.  Respiratory: Negative for  shortness of breath.   Cardiovascular: Negative for chest pain.  Gastrointestinal: Positive for abdominal pain, nausea and vomiting. Negative for constipation and diarrhea.  Genitourinary: Negative for dysuria and frequency.  Musculoskeletal: Negative for arthralgias, back pain and myalgias.  Skin: Negative for rash and wound.  Allergic/Immunologic: Negative for immunocompromised state.  Neurological: Negative for weakness.  Psychiatric/Behavioral: Negative for confusion.  All other systems reviewed and are negative.   Physical Exam Updated Vital Signs BP 131/79   Pulse 65   Temp (!) 97.5 F (36.4 C) (Oral)   Resp 18   SpO2 98%   Physical Exam Vitals and nursing note reviewed.  Constitutional:      General: She is not in acute distress.    Appearance: She is well-developed. She is not diaphoretic.  HENT:     Head: Normocephalic and atraumatic.  Cardiovascular:     Rate and Rhythm: Normal rate and regular rhythm.     Heart sounds: Normal heart sounds.  Pulmonary:     Effort: Pulmonary effort is normal.     Breath sounds: Normal breath sounds.  Abdominal:     General: Bowel sounds are increased.     Palpations: Abdomen is soft.     Tenderness: There is abdominal tenderness in the right upper quadrant. There is no right CVA tenderness or left CVA tenderness.  Musculoskeletal:     Right lower leg: No edema.     Left lower leg: No edema.  Skin:    General: Skin is warm and dry.  Neurological:     Mental Status: She is alert and oriented to person, place, and time.  Psychiatric:        Behavior: Behavior normal.     ED Results / Procedures / Treatments   Labs (all labs ordered are listed, but only abnormal results are displayed) Labs Reviewed  CBC WITH DIFFERENTIAL/PLATELET - Abnormal; Notable for the following components:      Result Value   Hemoglobin 11.9 (*)    All other components within normal limits  COMPREHENSIVE METABOLIC PANEL - Abnormal; Notable for the  following components:   Glucose, Bld 127 (*)    Creatinine, Ser 1.06 (*)    Total Protein 6.2 (*)    Alkaline Phosphatase 35 (*)    GFR, Estimated 51 (*)    All other components within normal limits  URINALYSIS, ROUTINE W REFLEX MICROSCOPIC - Abnormal; Notable for the following components:   APPearance CLOUDY (*)    Nitrite POSITIVE (*)    Leukocytes,Ua LARGE (*)    WBC, UA >50 (*)    Bacteria, UA MANY (*)    All other components within normal limits  LIPASE, BLOOD    EKG EKG Interpretation  Date/Time:  Thursday February 08 2021 09:08:11 EDT Ventricular Rate:  64 PR Interval:  178 QRS Duration: 118 QT Interval:  440 QTC Calculation: 454 R Axis:   41 Text Interpretation: Sinus  rhythm Incomplete left bundle branch block Minimal ST elevation, anterior leads No significant change since last tracing Confirmed by Melene PlanFloyd, Dan [phone]([id number]) on 02/08/2021 11:00:55 AM   Radiology CT Abdomen Pelvis W Contrast  Result Date: 02/08/2021 CLINICAL DATA:  Abdominal pain EXAM: CT ABDOMEN AND PELVIS WITH CONTRAST TECHNIQUE: Multidetector CT imaging of the abdomen and pelvis was performed using the standard protocol following bolus administration of intravenous contrast. CONTRAST:  100mL OMNIPAQUE IOHEXOL 300 MG/ML  SOLN COMPARISON:  05/27/2018 FINDINGS: Lower chest: No acute abnormality. Scarring and or atelectasis of the included bilateral lung bases Hepatobiliary: No focal liver abnormality is seen. Status post cholecystectomy. No biliary dilatation. Pancreas: Unremarkable. No pancreatic ductal dilatation or surrounding inflammatory changes. Spleen: Normal in size without significant abnormality. Adrenals/Urinary Tract: Adrenal glands are unremarkable. Kidneys are normal, without renal calculi, solid lesion, or hydronephrosis. Bladder is unremarkable. Stomach/Bowel: Stomach is within normal limits. Appendix is normal. No evidence of bowel wall thickening, distention, or inflammatory changes. Descending and  sigmoid diverticulosis Vascular/Lymphatic: Aortic atherosclerosis. No enlarged abdominal or pelvic lymph nodes. Reproductive: Status post hysterectomy. Other: No abdominal wall hernia or abnormality. No abdominopelvic ascites. Musculoskeletal: No acute or significant osseous findings. IMPRESSION: 1. No acute CT findings of the abdomen or pelvis to explain abdominal pain. 2. Descending and sigmoid diverticulosis without evidence of acute diverticulitis. 3. Status post cholecystectomy and hysterectomy. Aortic Atherosclerosis (ICD10-I70.0). Electronically Signed   By: Lauralyn PrimesAlex  Bibbey M.D.   On: 02/08/2021 12:13    Procedures Procedures   Medications Ordered in ED Medications  fentaNYL (SUBLIMAZE) injection 25 mcg (25 mcg Intravenous Given 02/08/21 1115)  iohexol (OMNIPAQUE) 300 MG/ML solution 100 mL (100 mLs Intravenous Contrast Given 02/08/21 1145)  cefTRIAXone (ROCEPHIN) 1 g in sodium chloride 0.9 % 100 mL IVPB (1 g Intravenous New Bag/Given 02/08/21 1243)    ED Course  I have reviewed the triage vital signs and the nursing notes.  Pertinent labs & imaging results that were available during my care of the patient were reviewed by me and considered in my medical decision making (see chart for details).  Clinical Course as of 02/08/21 1339  Thu Feb 08, 2021  [id number] 87 year old female with complaint of right upper quadrant abdominal pain with nausea and vomiting.  On exam found to have tenderness in her right upper quadrant.  Patient states similar symptoms in the past and diagnosed with "leaky gut." Labs indicate urinary tract infection with nitrite and leukocyte positive, many white blood cells and bacteria.  CMP without significant changes, lipase within normal limits, CBC with normal white blood cell count otherwise unremarkable. CT abdomen and pelvis without findings to explain patient's pain today. Discussed results with patient, will discharge with Keflex, treated with Rocephin in the emergency  room today.  Patient requesting additional pain medication, unsure what she has taken in the past but reports many allergies and intolerances.  Review of database, patient is previously prescribed tramadol.  Patient was given short course of tramadol and advised to follow up with her PCP. [LM]    Clinical Course User Index [LM] Alden HippMurphy, Tyrus Wilms A, PA-C   MDM Rules/Calculators/A&P                           Final Clinical Impression(s) / ED Diagnoses Final diagnoses:  Right upper quadrant abdominal pain  Urinary tract infection in female    Rx / DC Orders ED Discharge Orders         Ordered  cephALEXin (KEFLEX) 500 MG capsule  2 times daily        02/08/21 1328    traMADol (ULTRAM) 50 MG tablet  Every 6 hours PRN        02/08/21 1328           Alden Hipp 02/08/21 1340    Melene Plan, DO 02/08/21 1340

## 2021-02-08 NOTE — ED Notes (Signed)
Got patient undressed on the monitor did ekg shown to er provider patient is resting with call bell in reach 

## 2021-02-08 NOTE — ED Notes (Signed)
Provider notified pt is requesting more pain medication via secure chat

## 2021-02-08 NOTE — ED Triage Notes (Signed)
Pt BIB EMS from home due to abd pain. Pt reports it started this morning around 230am. Pt reports RLQ pain. Pt reports N&V. Pt has h/o "leaky gut". Pt states she feels like she is going to pass out when she stands up

## 2021-02-12 ENCOUNTER — Other Ambulatory Visit: Payer: Self-pay | Admitting: Internal Medicine

## 2021-02-12 ENCOUNTER — Ambulatory Visit
Admission: RE | Admit: 2021-02-12 | Discharge: 2021-02-12 | Disposition: A | Discharge status: Home / Self Care | Payer: Medicare Other | Source: Ambulatory Visit | Attending: Internal Medicine | Admitting: Internal Medicine

## 2021-02-12 DIAGNOSIS — N390 Urinary tract infection, site not specified: Secondary | ICD-10-CM | POA: Diagnosis not present

## 2021-02-12 DIAGNOSIS — R0781 Pleurodynia: Secondary | ICD-10-CM | POA: Diagnosis not present

## 2021-02-15 DIAGNOSIS — R1011 Right upper quadrant pain: Secondary | ICD-10-CM | POA: Diagnosis not present

## 2021-02-15 DIAGNOSIS — K921 Melena: Secondary | ICD-10-CM | POA: Diagnosis not present

## 2021-02-16 ENCOUNTER — Emergency Department (HOSPITAL_COMMUNITY)
Admission: EM | Admit: 2021-02-16 | Discharge: 2021-02-16 | Disposition: A | Discharge status: Home / Self Care | Payer: Medicare Other | Attending: Emergency Medicine | Admitting: Emergency Medicine

## 2021-02-16 ENCOUNTER — Emergency Department (HOSPITAL_COMMUNITY): Payer: Medicare Other

## 2021-02-16 ENCOUNTER — Other Ambulatory Visit: Payer: Self-pay

## 2021-02-16 ENCOUNTER — Encounter (HOSPITAL_COMMUNITY): Payer: Self-pay

## 2021-02-16 DIAGNOSIS — K219 Gastro-esophageal reflux disease without esophagitis: Secondary | ICD-10-CM | POA: Diagnosis not present

## 2021-02-16 DIAGNOSIS — R630 Anorexia: Secondary | ICD-10-CM | POA: Insufficient documentation

## 2021-02-16 DIAGNOSIS — Z743 Need for continuous supervision: Secondary | ICD-10-CM | POA: Diagnosis not present

## 2021-02-16 DIAGNOSIS — R6889 Other general symptoms and signs: Secondary | ICD-10-CM | POA: Diagnosis not present

## 2021-02-16 DIAGNOSIS — R1110 Vomiting, unspecified: Secondary | ICD-10-CM | POA: Diagnosis not present

## 2021-02-16 DIAGNOSIS — Z7982 Long term (current) use of aspirin: Secondary | ICD-10-CM | POA: Diagnosis not present

## 2021-02-16 DIAGNOSIS — R109 Unspecified abdominal pain: Secondary | ICD-10-CM | POA: Diagnosis not present

## 2021-02-16 DIAGNOSIS — R1031 Right lower quadrant pain: Secondary | ICD-10-CM | POA: Diagnosis not present

## 2021-02-16 LAB — CBC WITH DIFFERENTIAL/PLATELET
Abs Immature Granulocytes: 0.03 10*3/uL (ref 0.00–0.07)
Basophils Absolute: 0 10*3/uL (ref 0.0–0.1)
Basophils Relative: 0 %
Eosinophils Absolute: 0 10*3/uL (ref 0.0–0.5)
Eosinophils Relative: 0 %
HCT: 42.2 % (ref 36.0–46.0)
Hemoglobin: 13.8 g/dL (ref 12.0–15.0)
Immature Granulocytes: 0 %
Lymphocytes Relative: 19 %
Lymphs Abs: 2 10*3/uL (ref 0.7–4.0)
MCH: 30.4 pg (ref 26.0–34.0)
MCHC: 32.7 g/dL (ref 30.0–36.0)
MCV: 93 fL (ref 80.0–100.0)
Monocytes Absolute: 0.6 10*3/uL (ref 0.1–1.0)
Monocytes Relative: 5 %
Neutro Abs: 7.8 10*3/uL — ABNORMAL HIGH (ref 1.7–7.7)
Neutrophils Relative %: 76 %
Platelets: 279 10*3/uL (ref 150–400)
RBC: 4.54 MIL/uL (ref 3.87–5.11)
RDW: 14.4 % (ref 11.5–15.5)
WBC: 10.5 10*3/uL (ref 4.0–10.5)
nRBC: 0 % (ref 0.0–0.2)

## 2021-02-16 LAB — COMPREHENSIVE METABOLIC PANEL
ALT: 25 U/L (ref 0–44)
AST: 24 U/L (ref 15–41)
Albumin: 4 g/dL (ref 3.5–5.0)
Alkaline Phosphatase: 43 U/L (ref 38–126)
Anion gap: 12 (ref 5–15)
BUN: 12 mg/dL (ref 8–23)
CO2: 16 mmol/L — ABNORMAL LOW (ref 22–32)
Calcium: 9.7 mg/dL (ref 8.9–10.3)
Chloride: 112 mmol/L — ABNORMAL HIGH (ref 98–111)
Creatinine, Ser: 0.86 mg/dL (ref 0.44–1.00)
GFR, Estimated: >60 mL/min (ref >60)
Glucose, Bld: 120 mg/dL — ABNORMAL HIGH (ref 70–99)
Potassium: 4 mmol/L (ref 3.5–5.1)
Sodium: 140 mmol/L (ref 135–145)
Total Bilirubin: 0.4 mg/dL (ref 0.3–1.2)
Total Protein: 7.2 g/dL (ref 6.5–8.1)

## 2021-02-16 LAB — URINALYSIS, ROUTINE W REFLEX MICROSCOPIC
Bilirubin Urine: NEGATIVE
Glucose, UA: NEGATIVE mg/dL
Hgb urine dipstick: NEGATIVE
Ketones, ur: NEGATIVE mg/dL
Leukocytes,Ua: NEGATIVE
Nitrite: NEGATIVE
Protein, ur: NEGATIVE mg/dL
Specific Gravity, Urine: 1.01 (ref 1.005–1.030)
pH: 7 (ref 5.0–8.0)

## 2021-02-16 LAB — LIPASE, BLOOD: Lipase: 25 U/L (ref 11–51)

## 2021-02-16 LAB — LACTIC ACID, PLASMA: Lactic Acid, Venous: 2.1 mmol/L (ref 0.5–1.9)

## 2021-02-16 MED ORDER — FENTANYL CITRATE (PF) 100 MCG/2ML IJ SOLN
50.0000 ug | Freq: Once | INTRAMUSCULAR | Status: AC
  Administered 2021-02-16: 50 ug via INTRAVENOUS
  Filled 2021-02-16: qty 2

## 2021-02-16 MED ORDER — SENNOSIDES-DOCUSATE SODIUM 8.6-50 MG PO TABS: 2.0000 | ORAL_TABLET | Freq: Every day | ORAL | 0 refills | Status: AC

## 2021-02-16 MED ORDER — ONDANSETRON 4 MG PO TBDP: 4.0000 mg | ORAL_TABLET | Freq: Three times a day (TID) | ORAL | 0 refills | Status: AC | PRN

## 2021-02-16 MED ORDER — HYDROCODONE-ACETAMINOPHEN 5-325 MG PO TABS: 1.0000 | ORAL_TABLET | Freq: Four times a day (QID) | ORAL | 0 refills | Status: DC | PRN

## 2021-02-16 MED ORDER — HYDROMORPHONE HCL 1 MG/ML IJ SOLN
0.5000 mg | Freq: Once | INTRAMUSCULAR | Status: AC
  Administered 2021-02-16: 0.5 mg via INTRAVENOUS
  Filled 2021-02-16: qty 1

## 2021-02-16 MED ORDER — SODIUM CHLORIDE 0.9 % IV SOLN: INTRAVENOUS | Status: DC

## 2021-02-16 MED ORDER — IOHEXOL 300 MG/ML  SOLN
100.0000 mL | Freq: Once | INTRAMUSCULAR | Status: AC | PRN
  Administered 2021-02-16: 80 mL via INTRAVENOUS

## 2021-02-16 MED ORDER — ONDANSETRON HCL 4 MG/2ML IJ SOLN
4.0000 mg | Freq: Once | INTRAMUSCULAR | Status: AC
  Administered 2021-02-16: 4 mg via INTRAVENOUS
  Filled 2021-02-16: qty 2

## 2021-02-16 NOTE — Discharge Instructions (Addendum)
As discussed, your evaluation today has been largely reassuring.  But, it is important that you monitor your condition carefully, and do not hesitate to return to the ED if you develop new, or concerning changes in your condition.  Otherwise, please follow-up with your physician for appropriate ongoing care.  You can discuss referral to a gastroenterologist for consideration of colonoscopy or other testing at that point.

## 2021-02-16 NOTE — ED Notes (Signed)
3x failed butterfly stick for lactate level . Notified MD Jeraldine Loots

## 2021-02-16 NOTE — ED Notes (Signed)
As per daughter in law pt has had morphine and similar medications before without any allergic reaction.

## 2021-02-16 NOTE — ED Triage Notes (Signed)
RUQ abd pain, seen and treated at [facility] for UTI 3/31. States pain has not improved at all despite Tramadol Rx.

## 2021-02-16 NOTE — ED Provider Notes (Signed)
[facility]-EMERGENCY DEPT Provider Note   CSN: [id number] Arrival date & time: 02/16/21  1236     History Chief Complaint  Patient presents with  . Abdominal Pain    [facility] Crocket is a 87 y.o. female.  HPI Patient presents with her daughter who assists with the history. She presents due to ongoing abdominal pain.  Pain is focally in the right lower quadrant, with mild diffuse radiation.  Pain is sore, severe, not improved with narcotics provided by her primary care physician. Pain began about 1 week ago, seen and evaluated at our affiliated facility. She notes that she completed that course of antibiotics, has seen her physician twice, has had x-rays following CT performed 1 week ago, but continues to have worsening pain.  She has some vomiting, anorexia, had 1 bowel movement yesterday. No other new complaints including fever, chest pain, dyspnea.    Past Medical History:  Diagnosis Date  . Alopecia   . Chronic arthritis    of the wrists  . CVA (cerebral vascular accident) (HCC)   . GERD (gastroesophageal reflux disease)   . Headache(784.0)    tension now, migraines 1970's  . Hearing loss   . Lumbar radiculopathy   . Raynaud's phenomenon   . Stroke [facility]) 2001   tia 2011, balance problems, right leg numb at times    Patient Active Problem List   Diagnosis Date Noted  . Bilateral sensorineural hearing loss 09/08/2020  . Gait instability 03/25/2018  . History of stroke 03/25/2018    Past Surgical History:  Procedure Laterality Date  . ABDOMINAL HYSTERECTOMY    . BACK SURGERY  1980's   lower  . BREAST CYST ASPIRATION    . cholescystectomy    . COLONOSCOPY WITH PROPOFOL N/A 04/05/2014   Procedure: COLONOSCOPY WITH PROPOFOL;  Surgeon: Charolett Bumpers, MD;  Location: [facility] ENDOSCOPY;  Service: Endoscopy;  Laterality: N/A;  . ESOPHAGOGASTRODUODENOSCOPY (EGD) WITH PROPOFOL N/A 04/05/2014   Procedure: ESOPHAGOGASTRODUODENOSCOPY (EGD) WITH  PROPOFOL;  Surgeon: Charolett Bumpers, MD;  Location: [facility] ENDOSCOPY;  Service: Endoscopy;  Laterality: N/A;  . TONSILLECTOMY  yrs ago     OB History   No obstetric history on file.     History reviewed. No pertinent family history.  Social History   Tobacco Use  . Smoking status: Never Smoker  . Smokeless tobacco: Never Used  Vaping Use  . Vaping Use: Never used  Substance Use Topics  . Alcohol use: No  . Drug use: No    Home Medications Prior to Admission medications   Medication Sig Start Date End Date Taking? Authorizing Provider  HYDROcodone-acetaminophen (NORCO/VICODIN) 5-325 MG tablet Take 1 tablet by mouth every 6 (six) hours as needed for severe pain. 02/16/21  Yes Gerhard Munch, MD  ondansetron (ZOFRAN ODT) 4 MG disintegrating tablet Take 1 tablet (4 mg total) by mouth every 8 (eight) hours as needed for nausea or vomiting. 02/16/21  Yes Gerhard Munch, MD  senna-docusate (SENOKOT-S) 8.6-50 MG tablet Take 2 tablets by mouth daily for 10 days. 02/16/21 02/26/21 Yes Gerhard Munch, MD  acetaminophen (TYLENOL) 500 MG tablet Take 500 mg by mouth every 6 (six) hours as needed for moderate pain.     [provider]  aspirin 81 MG tablet Take 81 mg by mouth daily.     [provider]  celecoxib (CELEBREX) 200 MG capsule Take 200 mg by mouth daily.    [provider]  dicyclomine (BENTYL) 10 MG capsule  Take 1 capsule (10 mg total) by mouth 3 (three) times daily as needed for spasms. 05/27/18   Cristina GongHammond, Elizabeth W, PA-C  gabapentin (NEURONTIN) 300 MG capsule Take 300 mg by mouth at bedtime.    [provider]  losartan-hydrochlorothiazide (HYZAAR) 50-12.5 MG tablet Take 1 tablet by mouth daily.    [provider]  Multiple Vitamin (MULTIVITAMIN WITH MINERALS) TABS tablet Take 1 tablet by mouth daily.    [provider]  pantoprazole (PROTONIX) 40 MG tablet Take 40 mg by mouth as needed (reflux).     [provider]   triamcinolone cream (KENALOG) 0.1 % Apply 1 application topically daily as needed (rash).  01/08/18   [provider]    Allergies    Prednisone, Valium [diazepam], and Zocor [simvastatin]  Review of Systems   Review of Systems  Constitutional:       Per HPI, otherwise negative  HENT:       Per HPI, otherwise negative  Respiratory:       Per HPI, otherwise negative  Cardiovascular:       Per HPI, otherwise negative  Gastrointestinal: Positive for abdominal pain, constipation, nausea and vomiting.  Endocrine:       Negative aside from HPI  Genitourinary:       Neg aside from HPI   Musculoskeletal:       Per HPI, otherwise negative  Skin: Negative.   Neurological: Negative for syncope.    Physical Exam Updated Vital Signs BP (!) 159/65   Pulse 66   Temp 98.1 F (36.7 C) (Oral)   Resp 17   Ht 5\' 6"  (1.676 m)   Wt 64 kg   SpO2 94%   BMI 22.76 kg/m   Physical Exam Vitals and nursing note reviewed.  Constitutional:      General: She is not in acute distress.    Appearance: She is well-developed.  HENT:     Head: Normocephalic and atraumatic.  Eyes:     Conjunctiva/sclera: Conjunctivae normal.  Cardiovascular:     Rate and Rhythm: Normal rate and regular rhythm.  Pulmonary:     Effort: Pulmonary effort is normal. No respiratory distress.     Breath sounds: Normal breath sounds. No stridor.  Abdominal:     General: There is no distension.     Tenderness: There is abdominal tenderness in the right lower quadrant. There is guarding.  Skin:    General: Skin is warm and dry.  Neurological:     Mental Status: She is alert and oriented to person, place, and time.     Cranial Nerves: No cranial nerve deficit.     ED Results / Procedures / Treatments   Labs (all labs ordered are listed, but only abnormal results are displayed) Labs Reviewed  COMPREHENSIVE METABOLIC PANEL - Abnormal; Notable for the following components:      Result Value   Chloride 112  (*)    CO2 16 (*)    Glucose, Bld 120 (*)    All other components within normal limits  CBC WITH DIFFERENTIAL/PLATELET - Abnormal; Notable for the following components:   Neutro Abs 7.8 (*)    All other components within normal limits  LACTIC ACID, PLASMA - Abnormal; Notable for the following components:   Lactic Acid, Venous 2.1 (*)    All other components within normal limits  LIPASE, BLOOD  URINALYSIS, ROUTINE W REFLEX MICROSCOPIC    EKG None  Radiology CT ABDOMEN PELVIS W CONTRAST  Result  Date: 02/16/2021 CLINICAL DATA:  Abdominal pain EXAM: CT ABDOMEN AND PELVIS WITH CONTRAST TECHNIQUE: Multidetector CT imaging of the abdomen and pelvis was performed using the standard protocol following bolus administration of intravenous contrast. CONTRAST:  57mL OMNIPAQUE IOHEXOL 300 MG/ML  SOLN COMPARISON:  February 08, 2021 FINDINGS: Lower chest: There is bibasilar atelectatic change. No lung base edema or consolidation. Hepatobiliary: No focal liver lesions are appreciable. The gallbladder is absent. There is mild intrahepatic biliary duct dilatation, unchanged. No extrahepatic biliary duct dilatation evident. No biliary obstructing lesion evident by CT. Pancreas: There is no appreciable pancreatic mass or inflammatory focus. Spleen: No appreciable splenic lesions. Adrenals/Urinary Tract: Adrenals bilaterally appear unremarkable. There is no evident renal mass or hydronephrosis on either side. There is no appreciable renal or ureteral calculus on either side. Urinary bladder is midline with wall thickness within normal limits. Stomach/Bowel: There are again noted scattered sigmoid diverticula without diverticulitis. There is no appreciable bowel wall or mesenteric thickening. There is no evident bowel obstruction. Terminal ileum appears normal. Appendix unremarkable. No appreciable free air or portal venous air. Vascular/Lymphatic: There is no abdominal aortic aneurysm. There is aortic and iliac artery  atherosclerosis. Scattered foci of calcification in major mesenteric arterial vessels as well. Major venous structures appear patent. No evident adenopathy in the abdomen or pelvis. Reproductive: The uterus is absent.  No evident adnexal mass. Other: There is anterior abdominal wall rectus muscle thinning and fatty replacement. There is a midline ventral hernia at the level of the umbilicus containing fat. The neck of this hernia measures 1.7 cm from right to left dimension and 1.2 cm from superior to inferior dimension. No evident bowel containing hernia. Musculoskeletal: There is degenerative change in the lower thoracic and lumbar spine regions. Severe spinal stenosis noted at L3-4 and L4-5 due to bony hypertrophy and disc protrusion. No blastic or lytic bone lesions are evident. No intramuscular lesions are demonstrable. IMPRESSION: 1. Scattered sigmoid diverticula without diverticulitis. No bowel wall thickening or bowel obstruction. No abscess in the abdomen or pelvis. Appendix unremarkable. 2. Severe spinal stenosis at L3-4 and L4-5 due to bony hypertrophy and disc protrusion. 3. Absent gallbladder. Mild intrahepatic biliary duct dilatation is likely due to post cholecystectomy state. 4.  Uterus absent. 5.  Aortic Atherosclerosis (ICD10-I70.0). 6. Small midline ventral hernia containing only fat. Fatty infiltration in thinning of rectus muscles bilaterally noted. 7. No evident renal or ureteral calculus. No hydronephrosis. Urinary bladder wall thickness normal. Electronically Signed   By: Bretta Bang III M.D.   On: 02/16/2021 16:15    Procedures Procedures   Medications Ordered in ED Medications  0.9 %  sodium chloride infusion ( Intravenous New Bag/Given 02/16/21 1341)  fentaNYL (SUBLIMAZE) injection 50 mcg (50 mcg Intravenous Given 02/16/21 1341)  ondansetron (ZOFRAN) injection 4 mg (4 mg Intravenous Given 02/16/21 1341)  HYDROmorphone (DILAUDID) injection 0.5 mg (0.5 mg Intravenous Given 02/16/21  1455)  iohexol (OMNIPAQUE) 300 MG/ML solution 100 mL (80 mLs Intravenous Contrast Given 02/16/21 1537)    ED Course  I have reviewed the triage vital signs and the nursing notes.  Pertinent labs & imaging results that were available during my care of the patient were reviewed by me and considered in my medical decision making (see chart for details).   Update:, Patient in no distress, calm, pain is resolved. 7:49 PM Now after a delay in obtaining urinalysis, those results are reviewed, as well as CT scan, no notable findings, a lengthy conversation about today's evaluation, reassuring  CT scan, labs, urinalysis. Final Clinical Impression(s) / ED Diagnoses Final diagnoses:  Right lower quadrant abdominal pain    Rx / DC Orders ED Discharge Orders         Ordered    HYDROcodone-acetaminophen (NORCO/VICODIN) 5-325 MG tablet  Every 6 hours PRN        02/16/21 1949    senna-docusate (SENOKOT-S) 8.6-50 MG tablet  Daily        02/16/21 1949    ondansetron (ZOFRAN ODT) 4 MG disintegrating tablet  Every 8 hours PRN        02/16/21 1949           Gerhard Munch, MD 02/16/21 2328

## 2021-02-16 NOTE — ED Notes (Signed)
Patient waiting for change of clothes from son.

## 2021-02-19 DIAGNOSIS — R109 Unspecified abdominal pain: Secondary | ICD-10-CM | POA: Diagnosis not present

## 2021-02-28 DIAGNOSIS — R109 Unspecified abdominal pain: Secondary | ICD-10-CM | POA: Diagnosis not present

## 2021-04-06 DIAGNOSIS — K0510 Chronic gingivitis, plaque induced: Secondary | ICD-10-CM | POA: Diagnosis not present

## 2021-04-06 DIAGNOSIS — K649 Unspecified hemorrhoids: Secondary | ICD-10-CM | POA: Diagnosis not present

## 2021-04-06 DIAGNOSIS — R109 Unspecified abdominal pain: Secondary | ICD-10-CM | POA: Diagnosis not present

## 2021-04-06 DIAGNOSIS — R0781 Pleurodynia: Secondary | ICD-10-CM | POA: Diagnosis not present

## 2021-04-06 DIAGNOSIS — M48061 Spinal stenosis, lumbar region without neurogenic claudication: Secondary | ICD-10-CM | POA: Diagnosis not present

## 2021-05-08 DIAGNOSIS — E782 Mixed hyperlipidemia: Secondary | ICD-10-CM | POA: Diagnosis not present

## 2021-05-08 DIAGNOSIS — G4700 Insomnia, unspecified: Secondary | ICD-10-CM | POA: Diagnosis not present

## 2021-05-08 DIAGNOSIS — I10 Essential (primary) hypertension: Secondary | ICD-10-CM | POA: Diagnosis not present

## 2021-05-08 DIAGNOSIS — J42 Unspecified chronic bronchitis: Secondary | ICD-10-CM | POA: Diagnosis not present

## 2021-05-08 DIAGNOSIS — M1990 Unspecified osteoarthritis, unspecified site: Secondary | ICD-10-CM | POA: Diagnosis not present

## 2021-05-08 DIAGNOSIS — M8580 Other specified disorders of bone density and structure, unspecified site: Secondary | ICD-10-CM | POA: Diagnosis not present

## 2021-05-08 DIAGNOSIS — I6789 Other cerebrovascular disease: Secondary | ICD-10-CM | POA: Diagnosis not present

## 2021-05-08 DIAGNOSIS — I6340 Cerebral infarction due to embolism of unspecified cerebral artery: Secondary | ICD-10-CM | POA: Diagnosis not present

## 2021-05-08 DIAGNOSIS — K219 Gastro-esophageal reflux disease without esophagitis: Secondary | ICD-10-CM | POA: Diagnosis not present

## 2021-05-08 DIAGNOSIS — J45991 Cough variant asthma: Secondary | ICD-10-CM | POA: Diagnosis not present

## 2021-05-22 DIAGNOSIS — M48061 Spinal stenosis, lumbar region without neurogenic claudication: Secondary | ICD-10-CM | POA: Diagnosis not present

## 2021-05-22 DIAGNOSIS — M5418 Radiculopathy, sacral and sacrococcygeal region: Secondary | ICD-10-CM | POA: Diagnosis not present

## 2021-05-22 DIAGNOSIS — K649 Unspecified hemorrhoids: Secondary | ICD-10-CM | POA: Diagnosis not present

## 2021-05-22 DIAGNOSIS — R109 Unspecified abdominal pain: Secondary | ICD-10-CM | POA: Diagnosis not present

## 2021-05-22 DIAGNOSIS — K6289 Other specified diseases of anus and rectum: Secondary | ICD-10-CM | POA: Diagnosis not present

## 2021-06-25 DIAGNOSIS — I10 Essential (primary) hypertension: Secondary | ICD-10-CM | POA: Diagnosis not present

## 2021-06-25 DIAGNOSIS — E782 Mixed hyperlipidemia: Secondary | ICD-10-CM | POA: Diagnosis not present

## 2021-06-25 DIAGNOSIS — J40 Bronchitis, not specified as acute or chronic: Secondary | ICD-10-CM | POA: Diagnosis not present

## 2021-06-25 DIAGNOSIS — M8580 Other specified disorders of bone density and structure, unspecified site: Secondary | ICD-10-CM | POA: Diagnosis not present

## 2021-06-25 DIAGNOSIS — I6340 Cerebral infarction due to embolism of unspecified cerebral artery: Secondary | ICD-10-CM | POA: Diagnosis not present

## 2021-06-25 DIAGNOSIS — K219 Gastro-esophageal reflux disease without esophagitis: Secondary | ICD-10-CM | POA: Diagnosis not present

## 2021-06-25 DIAGNOSIS — M1990 Unspecified osteoarthritis, unspecified site: Secondary | ICD-10-CM | POA: Diagnosis not present

## 2021-06-25 DIAGNOSIS — G4700 Insomnia, unspecified: Secondary | ICD-10-CM | POA: Diagnosis not present

## 2021-06-25 DIAGNOSIS — J45991 Cough variant asthma: Secondary | ICD-10-CM | POA: Diagnosis not present

## 2021-06-25 DIAGNOSIS — I6789 Other cerebrovascular disease: Secondary | ICD-10-CM | POA: Diagnosis not present

## 2021-07-05 DIAGNOSIS — I10 Essential (primary) hypertension: Secondary | ICD-10-CM | POA: Diagnosis not present

## 2021-07-05 DIAGNOSIS — M5418 Radiculopathy, sacral and sacrococcygeal region: Secondary | ICD-10-CM | POA: Diagnosis not present

## 2021-07-05 DIAGNOSIS — K6289 Other specified diseases of anus and rectum: Secondary | ICD-10-CM | POA: Diagnosis not present

## 2021-07-05 DIAGNOSIS — M48062 Spinal stenosis, lumbar region with neurogenic claudication: Secondary | ICD-10-CM | POA: Diagnosis not present

## 2021-07-05 DIAGNOSIS — L989 Disorder of the skin and subcutaneous tissue, unspecified: Secondary | ICD-10-CM | POA: Diagnosis not present

## 2021-08-02 DIAGNOSIS — Z23 Encounter for immunization: Secondary | ICD-10-CM | POA: Diagnosis not present

## 2021-08-02 DIAGNOSIS — I10 Essential (primary) hypertension: Secondary | ICD-10-CM | POA: Diagnosis not present

## 2021-08-02 DIAGNOSIS — R8290 Unspecified abnormal findings in urine: Secondary | ICD-10-CM | POA: Diagnosis not present

## 2021-08-02 DIAGNOSIS — M2550 Pain in unspecified joint: Secondary | ICD-10-CM | POA: Diagnosis not present

## 2021-08-02 DIAGNOSIS — I672 Cerebral atherosclerosis: Secondary | ICD-10-CM | POA: Diagnosis not present

## 2021-08-02 DIAGNOSIS — E559 Vitamin D deficiency, unspecified: Secondary | ICD-10-CM | POA: Diagnosis not present

## 2021-08-02 DIAGNOSIS — E782 Mixed hyperlipidemia: Secondary | ICD-10-CM | POA: Diagnosis not present

## 2021-08-02 DIAGNOSIS — I739 Peripheral vascular disease, unspecified: Secondary | ICD-10-CM | POA: Diagnosis not present

## 2021-10-08 ENCOUNTER — Ambulatory Visit
Admission: RE | Admit: 2021-10-08 | Discharge: 2021-10-08 | Disposition: A | Discharge status: Home / Self Care | Payer: Medicare Other | Source: Ambulatory Visit | Attending: Physician Assistant | Admitting: Physician Assistant

## 2021-10-08 ENCOUNTER — Other Ambulatory Visit: Payer: Self-pay | Admitting: Physician Assistant

## 2021-10-08 ENCOUNTER — Other Ambulatory Visit: Payer: Self-pay

## 2021-10-08 DIAGNOSIS — M5442 Lumbago with sciatica, left side: Secondary | ICD-10-CM | POA: Diagnosis not present

## 2021-10-08 DIAGNOSIS — M25512 Pain in left shoulder: Secondary | ICD-10-CM | POA: Diagnosis not present

## 2021-10-08 DIAGNOSIS — G8929 Other chronic pain: Secondary | ICD-10-CM | POA: Diagnosis not present

## 2021-10-08 DIAGNOSIS — M545 Low back pain, unspecified: Secondary | ICD-10-CM | POA: Diagnosis not present

## 2021-12-04 DIAGNOSIS — G8929 Other chronic pain: Secondary | ICD-10-CM | POA: Diagnosis not present

## 2021-12-04 DIAGNOSIS — I10 Essential (primary) hypertension: Secondary | ICD-10-CM | POA: Diagnosis not present

## 2021-12-04 DIAGNOSIS — I739 Peripheral vascular disease, unspecified: Secondary | ICD-10-CM | POA: Diagnosis not present

## 2021-12-04 DIAGNOSIS — M25561 Pain in right knee: Secondary | ICD-10-CM | POA: Diagnosis not present

## 2021-12-04 DIAGNOSIS — I672 Cerebral atherosclerosis: Secondary | ICD-10-CM | POA: Diagnosis not present

## 2021-12-25 ENCOUNTER — Other Ambulatory Visit: Payer: Self-pay | Admitting: Internal Medicine

## 2021-12-25 DIAGNOSIS — Z1231 Encounter for screening mammogram for malignant neoplasm of breast: Secondary | ICD-10-CM

## 2022-01-15 ENCOUNTER — Ambulatory Visit: Payer: Medicare Other

## 2022-01-22 NOTE — H&P (Signed)
?  Patient: Rachael Buckley  PID: [id number]  DOB: 1931/03/21  SEX: Female  ? ? Self Referral  ? ?CC: Wants Self Referral extraction all remaining teeth so she can get dentures. ? ?Past Medical History:  Bronchitis, Raynauds, GERD, Stroke, Arthritis   ? ?Medications: Rosuvastatin, Gabapentin, Losartan/HCTZ, Aspirin, Biotin, Vitamin B6, vitamin C, Zinc, Centrum, Claritin, Amoxicillin   ? ?Allergies:     None   ? ?Surgeries:   Hysterectomy, gallbladder removal, back surgery    ? ?Social History       ?Smoking: n           ?Alcohol:n ?Drug use:n            ?                 ?Exam: BMI 22. Gross decay all remaining teeth # 2-15, 19, 21, 22, 23, 24, 25, 26, 27, 28, 29, 30. Palatal torus. Bilateral mandibular lingual tori. Bilateral maxillary and mandibular exostoses.  No purulence, edema, fluctuance, trismus. Oral cancer screening negative. Pharynx clear. No lymphadenopathy. ? ?Panorex:Gross decay all remaining teeth # 2-15, 19, 21, 22, 23, 24, 25, 26, 27, 28, 29, 30. ? ?Assessment: ASA . Non-restorable   teeth # 2-15, 19, 21, 22, 23, 24, 25, 26, 27, 28, 29, 30. Palatal torus. Bilateral mandibular lingual tori. Bilateral maxillary and mandibular exostoses.           ? ?Plan: 1. MD Clearance ? 2. Extraction Teeth # 2-15, 19, 21, 22, 23, 24, 25, 26, 27, 28, 29, 30.  Removal Palatal torus, removal  bilateral mandibular lingual tori. Removal bilateral maxillary and mandibular exostoses.  Hospital Day surgery.                ? ?Rx: n              ? ?Risks and complications explained. Questions answered.  ? ?Georgia Lopes, DMD ? ?

## 2022-01-25 ENCOUNTER — Encounter (HOSPITAL_COMMUNITY): Payer: Self-pay | Admitting: Oral Surgery

## 2022-01-25 NOTE — Progress Notes (Signed)
DUE TO COVID-19 ONLY ONE VISITOR IS ALLOWED TO COME WITH YOU AND STAY IN THE WAITING ROOM ONLY DURING PRE OP AND PROCEDURE DAY OF SURGERY.  ? ?PCP - Dr Marden Noble ?Cardiologist - n/a ? ?Chest x-ray - 02/12/21 (2V) ?EKG - 02/08/21 ?Stress Test - 2010 ?ECHO - 12/23/08 ?Cardiac Cath - n/a ? ?ICD Pacemaker/Loop - n/a ? ?Sleep Study -  n/a ?CPAP - none ? ?Aspirin Instructions: Follow your surgeon's instructions on when to stop aspirin prior to surgery,  If no instructions were given by your surgeon then you will need to call the office for those instructions. ? ?Anesthesia review: Yes ? ?STOP now taking any Aspirin (unless otherwise instructed by your surgeon), Aleve, Naproxen, Ibuprofen, Motrin, Advil, Goody's, BC's, all herbal medications, fish oil, and all vitamins.  ? ?Coronavirus Screening ?Covid test n/a Ambulatory Surgery ?Does patient have any of the following symptoms:  ?Cough yes/no: No ?Fever (>100.11F)  yes/no: No ?Runny nose yes/no: No ?Sore throat yes/no: No ?Difficulty breathing/shortness of breath  yes/no: No ? ?Has the patient traveled in the last 14 days and where? yes/no: No ? ?Son Juel Ripley [phone] verbalized understanding of instructions that were given via phone. ?

## 2022-01-28 ENCOUNTER — Encounter (HOSPITAL_COMMUNITY): Payer: Self-pay | Admitting: Oral Surgery

## 2022-01-28 ENCOUNTER — Other Ambulatory Visit: Payer: Self-pay

## 2022-01-28 ENCOUNTER — Ambulatory Visit (HOSPITAL_COMMUNITY)
Admission: RE | Admit: 2022-01-28 | Discharge: 2022-01-28 | Disposition: A | Discharge status: Home / Self Care | Payer: Medicare Other | Attending: Oral Surgery | Admitting: Oral Surgery

## 2022-01-28 ENCOUNTER — Encounter (HOSPITAL_COMMUNITY)
Admission: RE | Disposition: A | Discharge status: Home / Self Care | Payer: Self-pay | Source: Home / Self Care | Attending: Oral Surgery

## 2022-01-28 ENCOUNTER — Ambulatory Visit (HOSPITAL_COMMUNITY): Payer: Medicare Other | Admitting: Physician Assistant

## 2022-01-28 ENCOUNTER — Ambulatory Visit (HOSPITAL_BASED_OUTPATIENT_CLINIC_OR_DEPARTMENT_OTHER): Payer: Medicare Other | Admitting: Physician Assistant

## 2022-01-28 DIAGNOSIS — Z8673 Personal history of transient ischemic attack (TIA), and cerebral infarction without residual deficits: Secondary | ICD-10-CM | POA: Insufficient documentation

## 2022-01-28 DIAGNOSIS — K219 Gastro-esophageal reflux disease without esophagitis: Secondary | ICD-10-CM | POA: Insufficient documentation

## 2022-01-28 DIAGNOSIS — I10 Essential (primary) hypertension: Secondary | ICD-10-CM | POA: Diagnosis not present

## 2022-01-28 DIAGNOSIS — M278 Other specified diseases of jaws: Secondary | ICD-10-CM | POA: Diagnosis not present

## 2022-01-28 DIAGNOSIS — K029 Dental caries, unspecified: Secondary | ICD-10-CM | POA: Insufficient documentation

## 2022-01-28 DIAGNOSIS — M1990 Unspecified osteoarthritis, unspecified site: Secondary | ICD-10-CM | POA: Diagnosis not present

## 2022-01-28 DIAGNOSIS — K0889 Other specified disorders of teeth and supporting structures: Secondary | ICD-10-CM

## 2022-01-28 DIAGNOSIS — M270 Developmental disorders of jaws: Secondary | ICD-10-CM | POA: Diagnosis not present

## 2022-01-28 HISTORY — DX: Bronchitis, not specified as acute or chronic: J40

## 2022-01-28 HISTORY — DX: Essential (primary) hypertension: I10

## 2022-01-28 HISTORY — PX: TOOTH EXTRACTION: SHX859

## 2022-01-28 HISTORY — DX: Pneumonia, unspecified organism: J18.9

## 2022-01-28 LAB — CBC
HCT: 35.4 % — ABNORMAL LOW (ref 36.0–46.0)
Hemoglobin: 11.7 g/dL — ABNORMAL LOW (ref 12.0–15.0)
MCH: 30.5 pg (ref 26.0–34.0)
MCHC: 33.1 g/dL (ref 30.0–36.0)
MCV: 92.2 fL (ref 80.0–100.0)
Platelets: 271 10*3/uL (ref 150–400)
RBC: 3.84 MIL/uL — ABNORMAL LOW (ref 3.87–5.11)
RDW: 14.6 % (ref 11.5–15.5)
WBC: 7.4 10*3/uL (ref 4.0–10.5)
nRBC: 0 % (ref 0.0–0.2)

## 2022-01-28 LAB — BASIC METABOLIC PANEL
Anion gap: 12 (ref 5–15)
BUN: 20 mg/dL (ref 8–23)
CO2: 21 mmol/L — ABNORMAL LOW (ref 22–32)
Calcium: 10.3 mg/dL (ref 8.9–10.3)
Chloride: 107 mmol/L (ref 98–111)
Creatinine, Ser: 0.94 mg/dL (ref 0.44–1.00)
GFR, Estimated: 58 mL/min — ABNORMAL LOW (ref >60)
Glucose, Bld: 118 mg/dL — ABNORMAL HIGH (ref 70–99)
Potassium: 3.8 mmol/L (ref 3.5–5.1)
Sodium: 140 mmol/L (ref 135–145)

## 2022-01-28 SURGERY — DENTAL RESTORATION/EXTRACTIONS
Anesthesia: General | Site: Mouth

## 2022-01-28 MED ORDER — FENTANYL CITRATE (PF) 100 MCG/2ML IJ SOLN: 25.0000 ug | INTRAMUSCULAR | Status: DC | PRN

## 2022-01-28 MED ORDER — EPHEDRINE SULFATE-NACL 50-0.9 MG/10ML-% IV SOSY
PREFILLED_SYRINGE | INTRAVENOUS | Status: DC | PRN
  Administered 2022-01-28: 20 mg via INTRAVENOUS

## 2022-01-28 MED ORDER — OXYMETAZOLINE HCL 0.05 % NA SOLN
NASAL | Status: AC
  Filled 2022-01-28: qty 120

## 2022-01-28 MED ORDER — ROCURONIUM BROMIDE 10 MG/ML (PF) SYRINGE
PREFILLED_SYRINGE | INTRAVENOUS | Status: DC | PRN
  Administered 2022-01-28: 40 mg via INTRAVENOUS
  Administered 2022-01-28: 10 mg via INTRAVENOUS

## 2022-01-28 MED ORDER — ORAL CARE MOUTH RINSE: 15.0000 mL | Freq: Once | OROMUCOSAL | Status: AC

## 2022-01-28 MED ORDER — FENTANYL CITRATE (PF) 250 MCG/5ML IJ SOLN
INTRAMUSCULAR | Status: DC | PRN
  Administered 2022-01-28: 50 ug via INTRAVENOUS
  Administered 2022-01-28 (×2): 20 ug via INTRAVENOUS
  Administered 2022-01-28: 10 ug via INTRAVENOUS
  Administered 2022-01-28: 50 ug via INTRAVENOUS

## 2022-01-28 MED ORDER — ACETAMINOPHEN 10 MG/ML IV SOLN
INTRAVENOUS | Status: AC
  Filled 2022-01-28: qty 100

## 2022-01-28 MED ORDER — LIDOCAINE 2% (20 MG/ML) 5 ML SYRINGE
INTRAMUSCULAR | Status: DC | PRN
  Administered 2022-01-28: 40 mg via INTRAVENOUS

## 2022-01-28 MED ORDER — DEXAMETHASONE SODIUM PHOSPHATE 10 MG/ML IJ SOLN
INTRAMUSCULAR | Status: AC
  Filled 2022-01-28: qty 1

## 2022-01-28 MED ORDER — PROPOFOL 10 MG/ML IV BOLUS
INTRAVENOUS | Status: AC
  Filled 2022-01-28: qty 20

## 2022-01-28 MED ORDER — LIDOCAINE 2% (20 MG/ML) 5 ML SYRINGE
INTRAMUSCULAR | Status: AC
  Filled 2022-01-28: qty 5

## 2022-01-28 MED ORDER — DEXAMETHASONE SODIUM PHOSPHATE 10 MG/ML IJ SOLN
INTRAMUSCULAR | Status: DC | PRN
  Administered 2022-01-28: 5 mg via INTRAVENOUS

## 2022-01-28 MED ORDER — OXYMETAZOLINE HCL 0.05 % NA SOLN
NASAL | Status: DC | PRN
  Administered 2022-01-28 (×2): 1 via NASAL

## 2022-01-28 MED ORDER — LACTATED RINGERS IV SOLN: INTRAVENOUS | Status: DC

## 2022-01-28 MED ORDER — OXYCODONE HCL 5 MG PO TABS
5.0000 mg | ORAL_TABLET | Freq: Once | ORAL | Status: AC
  Administered 2022-01-28: 5 mg via ORAL

## 2022-01-28 MED ORDER — PROPOFOL 10 MG/ML IV BOLUS
INTRAVENOUS | Status: DC | PRN
Start: 2022-01-28 — End: 2022-01-28
  Administered 2022-01-28: 100 mg via INTRAVENOUS
  Administered 2022-01-28: 20 mg via INTRAVENOUS
  Administered 2022-01-28: 40 mg via INTRAVENOUS

## 2022-01-28 MED ORDER — ROCURONIUM BROMIDE 10 MG/ML (PF) SYRINGE
PREFILLED_SYRINGE | INTRAVENOUS | Status: AC
  Filled 2022-01-28: qty 10

## 2022-01-28 MED ORDER — LIDOCAINE-EPINEPHRINE 2 %-1:100000 IJ SOLN
INTRAMUSCULAR | Status: AC
  Filled 2022-01-28: qty 1

## 2022-01-28 MED ORDER — SODIUM CHLORIDE 0.9 % IR SOLN
Status: DC | PRN
  Administered 2022-01-28: 1

## 2022-01-28 MED ORDER — ONDANSETRON HCL 4 MG/2ML IJ SOLN
INTRAMUSCULAR | Status: DC | PRN
  Administered 2022-01-28: 4 mg via INTRAVENOUS

## 2022-01-28 MED ORDER — OXYMETAZOLINE HCL 0.05 % NA SOLN
NASAL | Status: AC
  Filled 2022-01-28: qty 30

## 2022-01-28 MED ORDER — PHENYLEPHRINE HCL-NACL 20-0.9 MG/250ML-% IV SOLN
INTRAVENOUS | Status: DC | PRN
  Administered 2022-01-28: 50 ug/min via INTRAVENOUS

## 2022-01-28 MED ORDER — OXYCODONE-ACETAMINOPHEN 5-325 MG PO TABS: 1.0000 | ORAL_TABLET | Freq: Four times a day (QID) | ORAL | 0 refills | Status: AC | PRN

## 2022-01-28 MED ORDER — ONDANSETRON HCL 4 MG/2ML IJ SOLN
INTRAMUSCULAR | Status: AC
  Filled 2022-01-28: qty 2

## 2022-01-28 MED ORDER — AMOXICILLIN 500 MG PO CAPS: 500.0000 mg | ORAL_CAPSULE | Freq: Two times a day (BID) | ORAL | 0 refills | Status: AC

## 2022-01-28 MED ORDER — MIDAZOLAM HCL 2 MG/2ML IJ SOLN
INTRAMUSCULAR | Status: AC
  Filled 2022-01-28: qty 2

## 2022-01-28 MED ORDER — ACETAMINOPHEN 10 MG/ML IV SOLN
1000.0000 mg | Freq: Once | INTRAVENOUS | Status: DC | PRN
  Administered 2022-01-28: 1000 mg via INTRAVENOUS

## 2022-01-28 MED ORDER — CHLORHEXIDINE GLUCONATE 0.12 % MT SOLN
15.0000 mL | Freq: Once | OROMUCOSAL | Status: AC
  Administered 2022-01-28: 15 mL via OROMUCOSAL
  Filled 2022-01-28: qty 15

## 2022-01-28 MED ORDER — LIDOCAINE-EPINEPHRINE 2 %-1:100000 IJ SOLN
INTRAMUSCULAR | Status: DC | PRN
  Administered 2022-01-28: 20 mL via INTRADERMAL

## 2022-01-28 MED ORDER — CEFAZOLIN SODIUM-DEXTROSE 2-4 GM/100ML-% IV SOLN
2.0000 g | INTRAVENOUS | Status: AC
  Administered 2022-01-28: 2 g via INTRAVENOUS
  Filled 2022-01-28: qty 100

## 2022-01-28 MED ORDER — OXYCODONE HCL 5 MG PO TABS
ORAL_TABLET | ORAL | Status: AC
  Filled 2022-01-28: qty 1

## 2022-01-28 MED ORDER — SUGAMMADEX SODIUM 200 MG/2ML IV SOLN
INTRAVENOUS | Status: DC | PRN
  Administered 2022-01-28: 128.8 mg via INTRAVENOUS

## 2022-01-28 MED ORDER — FENTANYL CITRATE (PF) 250 MCG/5ML IJ SOLN
INTRAMUSCULAR | Status: AC
Start: 2022-01-28 — End: ?
  Filled 2022-01-28: qty 5

## 2022-01-28 SURGICAL SUPPLY — 40 items
BAG COUNTER SPONGE SURGICOUNT (BAG) IMPLANT
BAG SPNG CNTER NS LX DISP (BAG)
BLADE SURG 15 STRL LF DISP TIS (BLADE) ×2 IMPLANT
BLADE SURG 15 STRL SS (BLADE) ×4
BUR CROSS CUT FISSURE 1.2 (BURR) ×1 IMPLANT
BUR CROSS CUT FISSURE 1.6 (BURR) ×2 IMPLANT
BUR EGG ELITE 4.0 (BURR) ×3 IMPLANT
CANISTER SUCT 3000ML PPV (MISCELLANEOUS) ×3 IMPLANT
COVER SURGICAL LIGHT HANDLE (MISCELLANEOUS) ×3 IMPLANT
DECANTER SPIKE VIAL GLASS SM (MISCELLANEOUS) ×2 IMPLANT
DRAPE U-SHAPE 76X120 STRL (DRAPES) ×3 IMPLANT
GAUZE 4X4 16PLY ~~LOC~~+RFID DBL (SPONGE) ×1 IMPLANT
GAUZE PACKING FOLDED 2  STR (GAUZE/BANDAGES/DRESSINGS) ×2
GAUZE PACKING FOLDED 2 STR (GAUZE/BANDAGES/DRESSINGS) ×2 IMPLANT
GLOVE SURG ENC MOIS LTX SZ6.5 (GLOVE) IMPLANT
GLOVE SURG ENC MOIS LTX SZ7 (GLOVE) IMPLANT
GLOVE SURG ENC MOIS LTX SZ8 (GLOVE) ×3 IMPLANT
GLOVE SURG UNDER POLY LF SZ6.5 (GLOVE) IMPLANT
GLOVE SURG UNDER POLY LF SZ7 (GLOVE) IMPLANT
GOWN STRL REUS W/ TWL LRG LVL3 (GOWN DISPOSABLE) ×2 IMPLANT
GOWN STRL REUS W/ TWL XL LVL3 (GOWN DISPOSABLE) ×2 IMPLANT
GOWN STRL REUS W/TWL LRG LVL3 (GOWN DISPOSABLE) ×2
GOWN STRL REUS W/TWL XL LVL3 (GOWN DISPOSABLE) ×2
IV NS 1000ML (IV SOLUTION) ×2
IV NS 1000ML BAXH (IV SOLUTION) ×2 IMPLANT
KIT BASIN OR (CUSTOM PROCEDURE TRAY) ×3 IMPLANT
KIT TURNOVER KIT B (KITS) ×3 IMPLANT
NDL HYPO 25GX1X1/2 BEV (NEEDLE) ×4 IMPLANT
NEEDLE HYPO 25GX1X1/2 BEV (NEEDLE) ×4 IMPLANT
NS IRRIG 1000ML POUR BTL (IV SOLUTION) ×3 IMPLANT
PAD ARMBOARD 7.5X6 YLW CONV (MISCELLANEOUS) ×3 IMPLANT
SLEEVE IRRIGATION ELITE 7 (MISCELLANEOUS) ×3 IMPLANT
SPONGE SURGIFOAM ABS GEL 12-7 (HEMOSTASIS) IMPLANT
SUT CHROMIC 3 0 PS 2 (SUTURE) ×4 IMPLANT
SUT CHROMIC 4 0 PS 2 18 (SUTURE) ×1 IMPLANT
SYR BULB IRRIG 60ML STRL (SYRINGE) ×3 IMPLANT
SYR CONTROL 10ML LL (SYRINGE) ×3 IMPLANT
TRAY ENT MC OR (CUSTOM PROCEDURE TRAY) ×3 IMPLANT
TUBING IRRIGATION (MISCELLANEOUS) ×3 IMPLANT
YANKAUER SUCT BULB TIP NO VENT (SUCTIONS) ×3 IMPLANT

## 2022-01-28 NOTE — Anesthesia Procedure Notes (Signed)
Procedure Name: Intubation ?Date/Time: 01/28/2022 9:18 AM ?Performed by: Cy Blamer, CRNA ?Pre-anesthesia Checklist: Patient identified, Emergency Drugs available, Suction available and Patient being monitored ?Patient Re-evaluated:Patient Re-evaluated prior to induction ?Oxygen Delivery Method: Circle system utilized ?Preoxygenation: Pre-oxygenation with 100% oxygen ?Induction Type: IV induction ?Ventilation: Mask ventilation without difficulty ?Laryngoscope Size: Hyacinth Meeker and 2 ?Grade View: Grade II ?Nasal Tubes: Nasal prep performed and Right ?Tube size: 7.0 mm ?Number of attempts: 2 ?Placement Confirmation: ETT inserted through vocal cords under direct vision, positive ETCO2 and breath sounds checked- equal and bilateral ?Tube secured with: Tape ?Dental Injury: Teeth and Oropharynx as per pre-operative assessment  ?Comments: 1st attempt by CRNA, Hyacinth Meeker 2, Grade II view, enlongated epiglottis, unable to advance tube. ?Final attempt by Anesthesiologist Hyacinth Meeker 2, Grade II view enlongated epiglottis, slight anterior pressure, rotation to advance ETT. ? ? ? ? ?

## 2022-01-28 NOTE — Op Note (Signed)
01/28/2022 ? ?10:49 AM ? ?PATIENT:  Rachael Buckley  88 y.o. female ? ?PRE-OPERATIVE DIAGNOSIS:  NON-RESTORABLE TEETH # 2, 3, 4, 5, 6, 7, 8, 9, 10, 11, 12, 13, 14, 15, 19, 21, 22, 23, 24, 25, 26, 27, 28, 29, 30 SECONDARY TO DENTAL CARIES, BILATERAL MANDIBULAR LINGUAL TORI, MAXILLARY PALATAL TORUS, MAXILLARY RIGHT AND LEFT BUCCAL EXOSTOSES ? ?POST-OPERATIVE DIAGNOSIS:  SAME+ MANDIBULAR LEFT BUCCAL EXOSTOSIS ? ?PROCEDURE:  Procedure(s):EXTRACTION TEETH # 2, 3, 4, 5, 6, 7, 8, 9, 10, 11, 12, 13, 14, 15, 19, 21, 22, 23, 24, 25, 26, 27, 28, 29, 30, ALVEOLOPLASTY RIGHT AND LEFT MAXILLA AND MANDIBLE, REMOVAL BILATERAL MANDIBULAR LINGUAL TORI, REMOVAL MAXILLARY PALATAL TORUS, REMOVAL MAXILLARY AND MANDIBULAR BUCCAL EXOSTOSES ? ?SURGEON:  Surgeon(s): ?Diona Browner, DMD ? ?ANESTHESIA:   local and general ? ?EBL:  minimal ? ?DRAINS: none  ? ?SPECIMEN:  No Specimen ? ?COUNTS:  YES ? ?PLAN OF CARE: Discharge to home after PACU ? ?PATIENT DISPOSITION:  PACU - hemodynamically stable. ?  ?PROCEDURE DETAILS: ?Dictation [id number] ? ?Gae Bon, DMD ?01/28/2022 ?10:49 AM ? ? ? ? ? ? ? ? ? ? ? ? ? ? ? ?  ?

## 2022-01-28 NOTE — H&P (Signed)
H&P documentation  -History and Physical Reviewed  -Patient has been re-examined  -No change in the plan of care  Rachael Buckley  

## 2022-01-28 NOTE — Op Note (Signed)
NAME: Rachael Buckley, Rachael Buckley. ?MEDICAL RECORD NO: [id number] ?ACCOUNT NO: [id number] ?DATE OF BIRTH: [birth year] ?FACILITY: [facility] ?LOCATION: [facility]-PERIOP ?PHYSICIAN: Gae Bon, DDS ? ?Operative Report  ? ?DATE OF PROCEDURE: 01/28/2022 ? ?PREOPERATIVE DIAGNOSIS:  Nonrestorable teeth secondary to dental caries numbers 2, 3, 4, 5, 6, 7, 8, 9, 10, 11, 12, 13, 14, 15, 19, 21, 22, 23, 24, 25, 26, 27, 28, 29, 30.  Bilateral mandibular lingual tori, maxillary palatal torus maxillary right and  ?left buccal exostoses. ? ?POSTOPERATIVE DIAGNOSIS:  Nonrestorable teeth secondary to dental caries numbers 2, 3, 4, 5, 6, 7, 8, 9, 10, 11, 12, 13, 14, 15, 19, 21, 22, 23, 24, 25, 26, 27, 28, 29, 30.  Bilateral mandibular lingual tori, maxillary palatal torus maxillary right and  ?left buccal exostoses plus mandibular left buccal exostosis. ? ?PROCEDURE:  Extraction of teeth numbers 2, 3, 4, 5, 6, 7, 8, 9, 10, 11, 12, 13, 14, 15, 19, 21, 22, 23, 24, 25, 26, 27, 28, 29, 30. Alveoloplasty right and left maxilla and mandible, removal of bilateral mandibular lingual tori, removal of maxillary  ?palatal torus, removal of maxillary mandibular buccal exostoses. ? ?SURGEON:  Gae Bon, DDS. ? ?ANESTHESIA:  General nasal intubation. ? ?DESCRIPTION OF PROCEDURE:  The patient was taken to the operating room and placed on the table in supine position.  General anesthesia was administered and nasal endotracheal tube was placed and secured.  The eyes were protected and the patient was  ?draped for surgery.  Timeout was performed.  The posterior pharynx was suctioned and a throat pack was placed.  2% lidocaine with 1:100,000 epinephrine was infiltrated in an inferior alveolar block on the right and left side and in buccal and palatal  ?infiltration in the maxilla and mandible.  A bite block was placed on the right side of the mouth.  A sweetheart retractor was used to retract the tongue.  A 15 blade was used to make an incision around teeth numbers 19,  21, 22, 23, 24, 25 and 26  ?buccally and lingually.  The periosteum was reflected.  The teeth were elevated and removed with the rongeurs.  The tissue was trimmed and then reflected to expose the buccal aspect of the left mandible. There was a large exostosis approximately 1 cm x 1 ? cm lateral on the mid portion of the mandibular body.  This was removed using the egg bur and then the sockets were curetted and alveoplasty was performed using the egg bur under irrigation and then the tissue was reflected lingually.  The lingual tori  ?were kissing tori and that they were meeting in the midline and were obstructing the motion of the tongue.  Care was taken to avoid damage to the mucosal tissue overlying the torus on the left side. After it was reflected the torus was cleaved from the  ?mandible by using the Stryker handpiece with the fissure bur to create a trough and then the torus was outfractured using the 301 dental elevator.  After the torus was removed the bone was smoothed with the egg bur followed by the bone file and then  ?closed with 3-0 chromic.  The left maxilla was operated next.  A 15 blade was used to make an incision distal buccal of tooth #15 carried forward to the palatal and lingual sulcus until tooth #6 was encountered.  The periosteum was reflected.  The teeth  ?were elevated with 301 elevator and removed with the dental forceps and rongeurs.  The  sockets were curetted.  Tissue was trimmed.  The periosteum was reflected to expose the alveolar crest, which had buccal exostoses on the lateral aspect. The exostoses ? were removed using the Stryker handpiece with the egg bur under irrigation and alveoplasty was performed to relieve undercuts.  Then, the area was irrigated and closed with 3-0 chromic.  The bite block and sweetheart retractor were repositioned to the  ?other side of the mouth.  A 15 blade was used to make an incision around teeth numbers 30, 29, 28 and 27 buccally and lingually.  The  periosteum was reflected.  The teeth were elevated and removed with the dental forceps.  The incision was carried  ?proximally, so that access could be gained to the buccal aspect of the mandible as well as the lingual. The lingual torus was dissected and removed using the Stryker handpiece with fissure bur to create a trough along the lingual aspect of the mandibular ? border and then the exostosis was cleaved with the 301 elevator and removed.  Then, the underlying bone was smoothed with the egg bur.  Alveoplasty was performed and then the area was irrigated and closed with 3-0 chromic.  Then, in the right maxilla,  ?the 15 blade was used to make an incision around teeth numbers 2, 3, 4, 5 and 6.  The teeth numbers 2 and 3 were removed with rongeur.  Teeth numbers 4, 5 and 6 required removal of inner proximal bone, so that the teeth could be elevated and removed.   ?Once these teeth were removed the periosteum was reflected to expose the alveolar crest, which was irregular in contour and had undercuts.  Alveoplasty was performed using the egg bur and then the areas were irrigated and closed with 3-0 chromic.  Then,  ?the palatal torus was removed, making a double Y incision overlying the palatal torus.  The tissue was reflected to expose the torus and the torus was removed using the egg bur to recontour the bone, so that the bone was smoothed.  Then, the area was  ?irrigated and closed with 4-0 chromic.  The oral cavity was then irrigated and suctioned.  Additional local anesthesia was administered.  The throat pack was removed.  The patient was left under care of anesthesia for extubation and transport to recovery ? room. ? ?ESTIMATED BLOOD LOSS:  Minimum. ? ?COMPLICATIONS:  None. ? ?SPECIMENS:  None. ? ? ?PUS ?D: 01/28/2022 10:57:32 am T: 01/28/2022 11:29:00 am  ?JOB: [id number]  ?

## 2022-01-28 NOTE — Anesthesia Preprocedure Evaluation (Signed)
Anesthesia Evaluation  ?Patient identified by MRN, date of birth, ID band ?Patient awake ? ? ? ?Reviewed: ?Allergy & Precautions, NPO status , Patient's Chart, lab work & pertinent test results ? ?Airway ?Mallampati: II ? ?TM Distance: >3 FB ?Neck ROM: Full ? ? ? Dental ? ?(+) Poor Dentition ?  ?Pulmonary ?neg pulmonary ROS,  ?  ?Pulmonary exam normal ? ? ? ? ? ? ? Cardiovascular ?hypertension, Pt. on medications ? ?Rhythm:Regular Rate:Normal ? ? ?  ?Neuro/Psych ? Headaches, CVA, Residual Symptoms negative psych ROS  ? GI/Hepatic ?Neg liver ROS, GERD  Medicated,  ?Endo/Other  ?negative endocrine ROS ? Renal/GU ?negative Renal ROS  ?negative genitourinary ?  ?Musculoskeletal ? ?(+) Arthritis , Osteoarthritis,   ? Abdominal ?Normal abdominal exam  (+)   ?Peds ? Hematology ?negative hematology ROS ?(+)   ?Anesthesia Other Findings ? ? Reproductive/Obstetrics ? ?  ? ? ? ? ? ? ? ? ? ? ? ? ? ?  ?  ? ? ? ? ? ? ? ? ?Anesthesia Physical ?Anesthesia Plan ? ?ASA: 3 ? ?Anesthesia Plan: General  ? ?Post-op Pain Management:   ? ?Induction: Intravenous ? ?PONV Risk Score and Plan: 3 and Ondansetron, Dexamethasone and Treatment may vary due to age or medical condition ? ?Airway Management Planned: Mask and Nasal ETT ? ?Additional Equipment: None ? ?Intra-op Plan:  ? ?Post-operative Plan: Extubation in OR ? ?Informed Consent: I have reviewed the patients History and Physical, chart, labs and discussed the procedure including the risks, benefits and alternatives for the proposed anesthesia with the patient or authorized representative who has indicated his/her understanding and acceptance.  ? ? ? ?Dental advisory given ? ?Plan Discussed with: Anesthesiologist and CRNA ? ?Anesthesia Plan Comments:   ? ? ? ? ? ? ?Anesthesia Quick Evaluation ? ?

## 2022-01-28 NOTE — Transfer of Care (Signed)
Immediate Anesthesia Transfer of Care Note ? ?Patient: Rachael Buckley ? ?Procedure(s) Performed: DENTAL RESTORATION/EXTRACTIONS (Mouth) ? ?Patient Location: PACU ? ?Anesthesia Type:General ? ?Level of Consciousness: awake, alert  and oriented ? ?Airway & Oxygen Therapy: Patient Spontanous Breathing and Patient connected to nasal cannula oxygen ? ?Post-op Assessment: Report given to RN, Post -op Vital signs reviewed and stable, Patient moving all extremities X 4 and Patient able to stick tongue midline ? ?Post vital signs: Reviewed ? ?Last Vitals:  ?Vitals Value Taken Time  ?BP 150/60 01/28/22 1100  ?Temp 98.6   ?Pulse 94 01/28/22 1101  ?Resp 18 01/28/22 1101  ?SpO2 98 % 01/28/22 1101  ?Vitals shown include unvalidated device data. ? ?Last Pain:  ?Vitals:  ? 01/28/22 0759  ?TempSrc:   ?PainSc: 0-No pain  ?   ? ?Patients Stated Pain Goal: 0 (01/28/22 0759) ? ?Complications: No notable events documented. ?

## 2022-01-28 NOTE — Anesthesia Postprocedure Evaluation (Signed)
Anesthesia Post Note ? ?Patient: Rachael Buckley ? ?Procedure(s) Performed: DENTAL RESTORATION/EXTRACTIONS (Mouth) ? ?  ? ?Patient location during evaluation: PACU ?Anesthesia Type: General ?Level of consciousness: awake and alert ?Pain management: pain level controlled ?Vital Signs Assessment: post-procedure vital signs reviewed and stable ?Respiratory status: spontaneous breathing, nonlabored ventilation, respiratory function stable and patient connected to nasal cannula oxygen ?Cardiovascular status: blood pressure returned to baseline and stable ?Postop Assessment: no apparent nausea or vomiting ?Anesthetic complications: no ? ? ?No notable events documented. ? ?Last Vitals:  ?Vitals:  ? 01/28/22 1128 01/28/22 1130  ?BP: (!) 144/55 (!) 144/55  ?Pulse: 89 86  ?Resp: 16 20  ?Temp:  36.5 ?C  ?SpO2: 94% 93%  ?  ?Last Pain:  ?Vitals:  ? 01/28/22 1130  ?TempSrc:   ?PainSc: 0-No pain  ? ? ?  ?  ?  ?  ?  ?  ? ?[name] ? ? ? ? ?

## 2022-01-29 ENCOUNTER — Encounter (HOSPITAL_COMMUNITY): Payer: Self-pay | Admitting: Oral Surgery

## 2022-04-02 ENCOUNTER — Ambulatory Visit
Admission: RE | Admit: 2022-04-02 | Discharge: 2022-04-02 | Disposition: A | Discharge status: Home / Self Care | Payer: Medicare Other | Source: Ambulatory Visit | Attending: Internal Medicine | Admitting: Internal Medicine

## 2022-04-02 DIAGNOSIS — Z1231 Encounter for screening mammogram for malignant neoplasm of breast: Secondary | ICD-10-CM | POA: Diagnosis not present

## 2022-04-19 DIAGNOSIS — I672 Cerebral atherosclerosis: Secondary | ICD-10-CM | POA: Diagnosis not present

## 2022-04-19 DIAGNOSIS — I739 Peripheral vascular disease, unspecified: Secondary | ICD-10-CM | POA: Diagnosis not present

## 2022-04-19 DIAGNOSIS — M48062 Spinal stenosis, lumbar region with neurogenic claudication: Secondary | ICD-10-CM | POA: Diagnosis not present

## 2022-08-12 DIAGNOSIS — W19XXXS Unspecified fall, sequela: Secondary | ICD-10-CM | POA: Diagnosis not present

## 2022-08-12 DIAGNOSIS — R42 Dizziness and giddiness: Secondary | ICD-10-CM | POA: Diagnosis not present

## 2022-08-12 DIAGNOSIS — Z8673 Personal history of transient ischemic attack (TIA), and cerebral infarction without residual deficits: Secondary | ICD-10-CM | POA: Diagnosis not present

## 2022-08-12 DIAGNOSIS — I69993 Ataxia following unspecified cerebrovascular disease: Secondary | ICD-10-CM | POA: Diagnosis not present

## 2022-08-12 DIAGNOSIS — K219 Gastro-esophageal reflux disease without esophagitis: Secondary | ICD-10-CM | POA: Diagnosis not present

## 2022-08-12 DIAGNOSIS — G629 Polyneuropathy, unspecified: Secondary | ICD-10-CM | POA: Diagnosis not present

## 2022-08-12 DIAGNOSIS — I10 Essential (primary) hypertension: Secondary | ICD-10-CM | POA: Diagnosis not present

## 2022-08-12 DIAGNOSIS — R531 Weakness: Secondary | ICD-10-CM | POA: Diagnosis not present

## 2022-09-12 DIAGNOSIS — F5101 Primary insomnia: Secondary | ICD-10-CM | POA: Diagnosis not present

## 2022-09-12 DIAGNOSIS — Z8673 Personal history of transient ischemic attack (TIA), and cerebral infarction without residual deficits: Secondary | ICD-10-CM | POA: Diagnosis not present

## 2022-10-14 DIAGNOSIS — I6521 Occlusion and stenosis of right carotid artery: Secondary | ICD-10-CM | POA: Diagnosis not present

## 2022-10-14 DIAGNOSIS — R42 Dizziness and giddiness: Secondary | ICD-10-CM | POA: Diagnosis not present

## 2022-10-14 DIAGNOSIS — R112 Nausea with vomiting, unspecified: Secondary | ICD-10-CM | POA: Diagnosis not present

## 2022-10-19 ENCOUNTER — Emergency Department (HOSPITAL_COMMUNITY)
Admission: EM | Admit: 2022-10-19 | Discharge: 2022-10-19 | Disposition: A | Discharge status: Home / Self Care | Payer: Medicare Other | Attending: Emergency Medicine | Admitting: Emergency Medicine

## 2022-10-19 ENCOUNTER — Emergency Department (HOSPITAL_COMMUNITY): Payer: Medicare Other

## 2022-10-19 ENCOUNTER — Encounter (HOSPITAL_COMMUNITY): Payer: Self-pay | Admitting: Emergency Medicine

## 2022-10-19 ENCOUNTER — Other Ambulatory Visit: Payer: Self-pay

## 2022-10-19 DIAGNOSIS — M25572 Pain in left ankle and joints of left foot: Secondary | ICD-10-CM | POA: Diagnosis not present

## 2022-10-19 DIAGNOSIS — H538 Other visual disturbances: Secondary | ICD-10-CM | POA: Diagnosis not present

## 2022-10-19 DIAGNOSIS — M25552 Pain in left hip: Secondary | ICD-10-CM | POA: Insufficient documentation

## 2022-10-19 DIAGNOSIS — W19XXXA Unspecified fall, initial encounter: Secondary | ICD-10-CM | POA: Insufficient documentation

## 2022-10-19 DIAGNOSIS — M25532 Pain in left wrist: Secondary | ICD-10-CM | POA: Diagnosis not present

## 2022-10-19 DIAGNOSIS — Y9389 Activity, other specified: Secondary | ICD-10-CM | POA: Insufficient documentation

## 2022-10-19 DIAGNOSIS — Z7982 Long term (current) use of aspirin: Secondary | ICD-10-CM | POA: Insufficient documentation

## 2022-10-19 DIAGNOSIS — M25512 Pain in left shoulder: Secondary | ICD-10-CM | POA: Diagnosis not present

## 2022-10-19 DIAGNOSIS — M19012 Primary osteoarthritis, left shoulder: Secondary | ICD-10-CM | POA: Diagnosis not present

## 2022-10-19 MED ORDER — ACETAMINOPHEN 500 MG PO TABS
1000.0000 mg | ORAL_TABLET | Freq: Once | ORAL | Status: AC
  Administered 2022-10-19: 1000 mg via ORAL
  Filled 2022-10-19: qty 2

## 2022-10-19 NOTE — Discharge Instructions (Signed)
You were seen today for left-sided pain after your fall.  The x-rays of your shoulder, wrist, leg did not show any fracture or dislocation.  Sometimes a fracture is very small and we do not see it on early x-rays.  If you have continued pain, please talk to her primary care provider about repeat imaging.  If you have any new or worsening symptoms, especially weakness on one side of your body, difficulty or changes in feeling, or severe pain that does not improve with Tylenol or ibuprofen, please return to the emergency department.  Otherwise please follow-up with your primary care provider.

## 2022-10-19 NOTE — ED Triage Notes (Signed)
Pt arrives after mechanical fall onto concrete at approx 5:30pm. Denies LOC, denies blood thinners. C/o left hip and left hand pain.

## 2022-10-19 NOTE — ED Provider Notes (Signed)
[facility] EMERGENCY DEPARTMENT Provider Note   CSN: [id number] Arrival date & time: 10/19/22  1831     History  Chief Complaint  Patient presents with   [facility] is a 88 y.o. female presenting with left-sided pain after ground-level fall earlier today.  Patient and her family member reports that patient was taking the trash out and her feet got tangled in her cane and she fell to the left side, falling to the ground.  She did not hit her head, did not lose consciousness.  She does not take blood thinning medications.  She was able to walk after the fall.  She then began to feel discomfort and pain in her left hip, left shoulder, left ankle.  She denies numbness, tingling.  She denies chest pain, shortness of breath, lightheadedness, dizziness, headache, vision changes.  Does have diminished vision in her left eye at baseline.       Home Medications Prior to Admission medications   Medication Sig Start Date End Date Taking? Authorizing Provider  acetaminophen (TYLENOL) 500 MG tablet Take 500-750 mg by mouth every 6 (six) hours as needed for moderate pain.    [provider]  amoxicillin (AMOXIL) 500 MG capsule Take 1 capsule (500 mg total) by mouth 2 (two) times daily. 01/28/22   Ocie Doyne, DMD  Ascorbic Acid (VITAMIN C PO) Take 1 tablet by mouth daily.    [provider]  aspirin 81 MG tablet Take 81 mg by mouth daily.     [provider]  dicyclomine (BENTYL) 10 MG capsule Take 1 capsule (10 mg total) by mouth 3 (three) times daily as needed for spasms. Patient not taking: Reported on 01/28/2022 05/27/18   Cristina Gong, PA-C  gabapentin (NEURONTIN) 300 MG capsule Take 300 mg by mouth at bedtime.    [provider]  losartan-hydrochlorothiazide (HYZAAR) 50-12.5 MG tablet Take 1 tablet by mouth daily.    [provider]  Multiple Vitamin (MULTIVITAMIN WITH MINERALS) TABS tablet Take 1  tablet by mouth daily.    [provider]  ondansetron (ZOFRAN ODT) 4 MG disintegrating tablet Take 1 tablet (4 mg total) by mouth every 8 (eight) hours as needed for nausea or vomiting. 02/16/21   Gerhard Munch, MD  oxyCODONE-acetaminophen (PERCOCET) 5-325 MG tablet Take 1 tablet by mouth every 6 (six) hours as needed. 01/28/22   Ocie Doyne, DMD  rosuvastatin (CRESTOR) 5 MG tablet Take 5 mg by mouth daily. 01/16/22   [provider]  triamcinolone cream (KENALOG) 0.1 % Apply 1 application topically daily as needed (rash).  01/08/18   [provider]  VITAMIN D PO Take 1 tablet by mouth daily.    [provider]      Allergies    Hydrocodone-acetaminophen, Prednisone, Valium [diazepam], and Zocor [simvastatin]    Review of Systems   Review of Systems  Physical Exam Updated Vital Signs BP (!) 135/50   Pulse 62   Temp 98.5 F (36.9 C) (Oral)   Resp 18   Ht 5\' 5"  (1.651 m)   Wt 59.4 kg   SpO2 99%   BMI 21.80 kg/m  Physical Exam HENT:     Head: Normocephalic and atraumatic.     Right Ear: Tympanic membrane, ear canal and external ear normal.     Left Ear: Tympanic membrane, ear canal and external ear normal.     Nose: Nose normal.     Comments: Atraumatic  Mouth/Throat:     Mouth: Mucous membranes are moist.     Pharynx: Oropharynx is clear. No oropharyngeal exudate or posterior oropharyngeal erythema.  Eyes:     Extraocular Movements: Extraocular movements intact.     Conjunctiva/sclera: Conjunctivae normal.     Pupils: Pupils are equal, round, and reactive to light.  Cardiovascular:     Rate and Rhythm: Normal rate and regular rhythm.  Pulmonary:     Effort: Pulmonary effort is normal.     Breath sounds: Normal breath sounds.     Comments: Chest wall is a test wall is atraumatic Abdominal:     General: There is no distension.     Tenderness: There is no abdominal tenderness. There is no guarding or rebound.  Musculoskeletal:         General: No swelling or deformity.     Cervical back: Normal range of motion. No tenderness.     Right lower leg: No edema.     Left lower leg: No edema.     Comments: Right extremities are atraumatic  Left upper extremity: Sensation intact, strong radial pulse.  No deformities, crepitus, abrasions, or bruising noted.  She is slightly tender to palpation on the posterior aspect of her humeral head.  She is able to flex, abduct, adduct her shoulder.  Not tender palpation on elbow, full range of motion of elbow intact.  She has some swelling on the dorsal aspect of her hand that she states has been there for "quite a long time."  She has some tenderness in her medial wrist.  No snuffbox tenderness.  Left lower extremity: No deformities.  No crepitus.  Patient is slightly tender to palpation on hip, is able to flex and hold leg against gravity with ease.  She does endorse some pain in her groin with hip flexion.  Slightly TTP on posterior aspect of medial malleolus.  Strong DP.  Sensation intact.  No midline spinal tenderness on C/T/L-spine  Neurological:     Mental Status: She is alert.     ED Results / Procedures / Treatments   Labs (all labs ordered are listed, but only abnormal results are displayed) Labs Reviewed - No data to display  EKG None  Radiology DG Wrist Complete Left  Result Date: 10/19/2022 CLINICAL DATA:  Fall, left wrist pain EXAM: LEFT WRIST - COMPLETE 3+ VIEW COMPARISON:  None Available. FINDINGS: No acute fracture or dislocation. Corticated ossific density seen dorsal to the carpus on lateral examination may represent a remote triquetral fracture. Disruption of the scapholunate ligament with proximally gray shin of the capitate in keeping with a [facility] wrist. Advanced radiocarpal degenerative arthritis with near complete loss of the radioscaphoid joint space. Degenerative chondrocalcinosis of the TFCC joint space narrowing and subchondral sclerosis involving the midcarpal  articulations of the lunate, capitate, and hamate in keeping with advanced degenerative arthritis in this location. Moderate soft tissue swelling dorsal to the carpus. IMPRESSION: 1. Moderate soft tissue swelling. No acute fracture or dislocation. 2. Possible remote triquetral fracture. 3. [facility] wrist. 4. Advanced radiocarpal and midcarpal degenerative arthritis as outlined above. Electronically Signed   By: Helyn Numbers M.D.   On: 10/19/2022 20:19   DG Ankle Complete Left  Result Date: 10/19/2022 CLINICAL DATA:  Left ankle pain, fall EXAM: LEFT ANKLE COMPLETE - 3+ VIEW COMPARISON:  None Available. FINDINGS: Normal alignment. No acute fracture or dislocation. Vascular calcifications noted. Soft tissues otherwise unremarkable. No ankle effusion. IMPRESSION: 1. No acute fracture or dislocation. Electronically  Signed   By: Helyn Numbers M.D.   On: 10/19/2022 20:16   DG Hip Unilat W or Wo Pelvis 2-3 Views Left  Result Date: 10/19/2022 CLINICAL DATA:  Fall, left hip pain EXAM: DG HIP (WITH OR WITHOUT PELVIS) 2-3V LEFT COMPARISON:  None Available. FINDINGS: Moderate asymmetric left hip degenerative arthritis. Normal alignment. No acute fracture or dislocation. Soft tissues are unremarkable. IMPRESSION: 1. Moderate asymmetric left hip degenerative arthritis. Electronically Signed   By: Helyn Numbers M.D.   On: 10/19/2022 20:15   DG Shoulder Left  Result Date: 10/19/2022 CLINICAL DATA:  Fall, left shoulder pain EXAM: LEFT SHOULDER - 2+ VIEW COMPARISON:  None Available. FINDINGS: Normal alignment. No acute fracture or dislocation. Severe glenohumeral and mild acromioclavicular degenerative arthritis noted. Limited evaluation of the left hemithorax is unremarkable. IMPRESSION: 1. Severe glenohumeral and mild acromioclavicular degenerative arthritis. Electronically Signed   By: Helyn Numbers M.D.   On: 10/19/2022 20:14    Procedures Procedures    Medications Ordered in ED Medications  acetaminophen  (TYLENOL) tablet 1,000 mg (1,000 mg Oral Given 10/19/22 2010)    ED Course/ Medical Decision Making/ A&P                           Medical Decision Making Amount and/or Complexity of Data Reviewed Radiology: ordered and independent interpretation performed. Decision-making details documented in ED Course.  Risk OTC drugs.   Patient presents as above.  She is not on blood thinning medications.  She did not hit her head or lose consciousness.  Fall is described as mechanical fall.  Patient recalls the entire event.  I do not think this was syncope based on description of event.  Patient overall has excellent range of motion and no obvious deformities or injuries.  Due to age and mechanism will evaluate areas with point tenderness with x-rays.  Tylenol given for analgesia.  X-rays of shoulder, wrist, hip, ankle, negative for acute fracture or malalignment.  Patient is overall well-appearing, able to ambulate, vitals remained stable throughout her time in the emergency department.  No acute injuries noted on x-rays.  She is stable for discharge.  Symptomatic care discussed.  Plan to follow-up with PCP.  Return precautions given.        Final Clinical Impression(s) / ED Diagnoses Final diagnoses:  Fall, initial encounter    Rx / DC Orders ED Discharge Orders     None         Linward Foster, MD 10/19/22 2224    Eber Hong, MD 10/20/22 [phone]

## 2022-11-15 DIAGNOSIS — I10 Essential (primary) hypertension: Secondary | ICD-10-CM | POA: Diagnosis not present

## 2022-11-15 DIAGNOSIS — R269 Unspecified abnormalities of gait and mobility: Secondary | ICD-10-CM | POA: Diagnosis not present

## 2022-11-15 DIAGNOSIS — M549 Dorsalgia, unspecified: Secondary | ICD-10-CM | POA: Diagnosis not present

## 2022-11-15 DIAGNOSIS — Z8673 Personal history of transient ischemic attack (TIA), and cerebral infarction without residual deficits: Secondary | ICD-10-CM | POA: Diagnosis not present

## 2022-11-15 DIAGNOSIS — M159 Polyosteoarthritis, unspecified: Secondary | ICD-10-CM | POA: Diagnosis not present

## 2022-11-15 DIAGNOSIS — M19019 Primary osteoarthritis, unspecified shoulder: Secondary | ICD-10-CM | POA: Diagnosis not present

## 2022-11-15 DIAGNOSIS — M8580 Other specified disorders of bone density and structure, unspecified site: Secondary | ICD-10-CM | POA: Diagnosis not present

## 2023-07-09 DIAGNOSIS — H903 Sensorineural hearing loss, bilateral: Secondary | ICD-10-CM | POA: Diagnosis not present

## 2023-07-21 DIAGNOSIS — E782 Mixed hyperlipidemia: Secondary | ICD-10-CM | POA: Diagnosis not present

## 2023-07-21 DIAGNOSIS — D649 Anemia, unspecified: Secondary | ICD-10-CM | POA: Diagnosis not present

## 2023-07-21 DIAGNOSIS — G4700 Insomnia, unspecified: Secondary | ICD-10-CM | POA: Diagnosis not present

## 2023-07-21 DIAGNOSIS — I69993 Ataxia following unspecified cerebrovascular disease: Secondary | ICD-10-CM | POA: Diagnosis not present

## 2023-07-21 DIAGNOSIS — E46 Unspecified protein-calorie malnutrition: Secondary | ICD-10-CM | POA: Diagnosis not present

## 2023-07-21 DIAGNOSIS — R54 Age-related physical debility: Secondary | ICD-10-CM | POA: Diagnosis not present

## 2023-07-21 DIAGNOSIS — I10 Essential (primary) hypertension: Secondary | ICD-10-CM | POA: Diagnosis not present

## 2023-07-21 DIAGNOSIS — R61 Generalized hyperhidrosis: Secondary | ICD-10-CM | POA: Diagnosis not present

## 2023-07-21 DIAGNOSIS — R269 Unspecified abnormalities of gait and mobility: Secondary | ICD-10-CM | POA: Diagnosis not present

## 2023-07-21 DIAGNOSIS — M5417 Radiculopathy, lumbosacral region: Secondary | ICD-10-CM | POA: Diagnosis not present

## 2023-08-07 DIAGNOSIS — Z0000 Encounter for general adult medical examination without abnormal findings: Secondary | ICD-10-CM | POA: Diagnosis not present

## 2023-08-07 DIAGNOSIS — Z1331 Encounter for screening for depression: Secondary | ICD-10-CM | POA: Diagnosis not present

## 2023-08-07 DIAGNOSIS — Z23 Encounter for immunization: Secondary | ICD-10-CM | POA: Diagnosis not present

## 2023-08-28 DIAGNOSIS — H903 Sensorineural hearing loss, bilateral: Secondary | ICD-10-CM | POA: Diagnosis not present

## 2023-10-14 DIAGNOSIS — J45991 Cough variant asthma: Secondary | ICD-10-CM | POA: Diagnosis not present

## 2023-10-14 DIAGNOSIS — Z9181 History of falling: Secondary | ICD-10-CM | POA: Diagnosis not present

## 2023-10-14 DIAGNOSIS — I739 Peripheral vascular disease, unspecified: Secondary | ICD-10-CM | POA: Diagnosis not present

## 2023-10-14 DIAGNOSIS — I69993 Ataxia following unspecified cerebrovascular disease: Secondary | ICD-10-CM | POA: Diagnosis not present

## 2023-10-14 DIAGNOSIS — R61 Generalized hyperhidrosis: Secondary | ICD-10-CM | POA: Diagnosis not present

## 2023-11-28 DIAGNOSIS — E46 Unspecified protein-calorie malnutrition: Secondary | ICD-10-CM | POA: Diagnosis not present

## 2023-11-28 DIAGNOSIS — I10 Essential (primary) hypertension: Secondary | ICD-10-CM | POA: Diagnosis not present

## 2023-11-28 DIAGNOSIS — J42 Unspecified chronic bronchitis: Secondary | ICD-10-CM | POA: Diagnosis not present

## 2023-11-28 DIAGNOSIS — R634 Abnormal weight loss: Secondary | ICD-10-CM | POA: Diagnosis not present

## 2023-11-28 DIAGNOSIS — Z8673 Personal history of transient ischemic attack (TIA), and cerebral infarction without residual deficits: Secondary | ICD-10-CM | POA: Diagnosis not present

## 2023-11-28 DIAGNOSIS — G629 Polyneuropathy, unspecified: Secondary | ICD-10-CM | POA: Diagnosis not present

## 2023-11-28 DIAGNOSIS — R739 Hyperglycemia, unspecified: Secondary | ICD-10-CM | POA: Diagnosis not present

## 2023-11-28 DIAGNOSIS — W19XXXA Unspecified fall, initial encounter: Secondary | ICD-10-CM | POA: Diagnosis not present

## 2023-11-28 DIAGNOSIS — R269 Unspecified abnormalities of gait and mobility: Secondary | ICD-10-CM | POA: Diagnosis not present

## 2023-12-05 DIAGNOSIS — R739 Hyperglycemia, unspecified: Secondary | ICD-10-CM | POA: Diagnosis not present

## 2024-03-05 DIAGNOSIS — M549 Dorsalgia, unspecified: Secondary | ICD-10-CM | POA: Diagnosis not present

## 2024-03-05 DIAGNOSIS — I10 Essential (primary) hypertension: Secondary | ICD-10-CM | POA: Diagnosis not present

## 2024-03-05 DIAGNOSIS — M5116 Intervertebral disc disorders with radiculopathy, lumbar region: Secondary | ICD-10-CM | POA: Diagnosis not present

## 2024-03-05 DIAGNOSIS — J42 Unspecified chronic bronchitis: Secondary | ICD-10-CM | POA: Diagnosis not present

## 2024-06-11 DIAGNOSIS — G629 Polyneuropathy, unspecified: Secondary | ICD-10-CM | POA: Diagnosis not present

## 2024-06-11 DIAGNOSIS — R634 Abnormal weight loss: Secondary | ICD-10-CM | POA: Diagnosis not present

## 2024-06-11 DIAGNOSIS — N1831 Chronic kidney disease, stage 3a: Secondary | ICD-10-CM | POA: Diagnosis not present

## 2024-06-11 DIAGNOSIS — N390 Urinary tract infection, site not specified: Secondary | ICD-10-CM | POA: Diagnosis not present

## 2024-06-11 DIAGNOSIS — M1990 Unspecified osteoarthritis, unspecified site: Secondary | ICD-10-CM | POA: Diagnosis not present

## 2024-06-11 DIAGNOSIS — R350 Frequency of micturition: Secondary | ICD-10-CM | POA: Diagnosis not present

## 2024-06-11 DIAGNOSIS — R232 Flushing: Secondary | ICD-10-CM | POA: Diagnosis not present
